# Patient Record
Sex: Male | Born: 1975 | Race: White | Hispanic: No | Smoking: Former smoker
Health system: Southern US, Community
[De-identification: ages and names within clinical notes are randomized; demographics above are authoritative.]

## PROBLEM LIST (undated history)

## (undated) DIAGNOSIS — E039 Hypothyroidism, unspecified: Secondary | ICD-10-CM

## (undated) DIAGNOSIS — Q6239 Other obstructive defects of renal pelvis and ureter: Secondary | ICD-10-CM

## (undated) DIAGNOSIS — K5720 Diverticulitis of large intestine with perforation and abscess without bleeding: Secondary | ICD-10-CM

## (undated) DIAGNOSIS — K219 Gastro-esophageal reflux disease without esophagitis: Secondary | ICD-10-CM

## (undated) DIAGNOSIS — K9041 Non-celiac gluten sensitivity: Secondary | ICD-10-CM

## (undated) DIAGNOSIS — Q6211 Congenital occlusion of ureteropelvic junction: Secondary | ICD-10-CM

## (undated) DIAGNOSIS — Z87442 Personal history of urinary calculi: Secondary | ICD-10-CM

## (undated) HISTORY — PX: CYSTOSCOPY WITH URETEROSCOPY, STONE BASKETRY AND STENT PLACEMENT: SHX6378

## (undated) HISTORY — PX: COSMETIC SURGERY: SHX468

## (undated) HISTORY — PX: LITHOTRIPSY: SUR834

## (undated) HISTORY — PX: FINGER AMPUTATION: SHX636

## (undated) HISTORY — PX: URETEROSCOPY: SHX842

## (undated) HISTORY — PX: ENDOPYELOTOMY: SHX1501

---

## 2013-07-19 ENCOUNTER — Emergency Department (HOSPITAL_BASED_OUTPATIENT_CLINIC_OR_DEPARTMENT_OTHER): Payer: 59

## 2013-07-19 ENCOUNTER — Encounter (HOSPITAL_BASED_OUTPATIENT_CLINIC_OR_DEPARTMENT_OTHER): Payer: Self-pay | Admitting: Emergency Medicine

## 2013-07-19 ENCOUNTER — Observation Stay (HOSPITAL_BASED_OUTPATIENT_CLINIC_OR_DEPARTMENT_OTHER)
Admission: EM | Admit: 2013-07-19 | Discharge: 2013-07-20 | Disposition: A | Discharge status: Home / Self Care | Payer: 59 | Attending: Urology | Admitting: Urology

## 2013-07-19 DIAGNOSIS — E039 Hypothyroidism, unspecified: Secondary | ICD-10-CM | POA: Insufficient documentation

## 2013-07-19 DIAGNOSIS — N132 Hydronephrosis with renal and ureteral calculous obstruction: Secondary | ICD-10-CM

## 2013-07-19 DIAGNOSIS — N1330 Unspecified hydronephrosis: Secondary | ICD-10-CM | POA: Insufficient documentation

## 2013-07-19 DIAGNOSIS — N200 Calculus of kidney: Principal | ICD-10-CM | POA: Insufficient documentation

## 2013-07-19 DIAGNOSIS — Q6239 Other obstructive defects of renal pelvis and ureter: Secondary | ICD-10-CM | POA: Insufficient documentation

## 2013-07-19 DIAGNOSIS — Z87891 Personal history of nicotine dependence: Secondary | ICD-10-CM | POA: Insufficient documentation

## 2013-07-19 DIAGNOSIS — Q6211 Congenital occlusion of ureteropelvic junction: Secondary | ICD-10-CM

## 2013-07-19 DIAGNOSIS — S68118A Complete traumatic metacarpophalangeal amputation of other finger, initial encounter: Secondary | ICD-10-CM | POA: Insufficient documentation

## 2013-07-19 HISTORY — DX: Congenital occlusion of ureteropelvic junction: Q62.11

## 2013-07-19 HISTORY — DX: Other obstructive defects of renal pelvis and ureter: Q62.39

## 2013-07-19 HISTORY — DX: Non-celiac gluten sensitivity: K90.41

## 2013-07-19 HISTORY — DX: Hypothyroidism, unspecified: E03.9

## 2013-07-19 MED ORDER — SODIUM CHLORIDE 0.9 % IV SOLN
INTRAVENOUS | Status: DC
  Administered 2013-07-19 – 2013-07-20 (×2): via INTRAVENOUS

## 2013-07-19 MED ORDER — HYDROMORPHONE HCL PF 1 MG/ML IJ SOLN
1.0000 mg | Freq: Once | INTRAMUSCULAR | Status: AC
  Administered 2013-07-19: 1 mg via INTRAVENOUS
  Filled 2013-07-19: qty 1

## 2013-07-19 MED ORDER — KETOROLAC TROMETHAMINE 30 MG/ML IJ SOLN
30.0000 mg | Freq: Once | INTRAMUSCULAR | Status: AC
  Administered 2013-07-19: 30 mg via INTRAVENOUS
  Filled 2013-07-19: qty 2

## 2013-07-19 MED ORDER — HYDROMORPHONE HCL PF 1 MG/ML IJ SOLN: 1.0000 mg | Freq: Once | INTRAMUSCULAR | Status: AC | PRN

## 2013-07-19 MED ORDER — ONDANSETRON HCL 4 MG/2ML IJ SOLN
4.0000 mg | Freq: Once | INTRAMUSCULAR | Status: AC
  Administered 2013-07-19: 4 mg via INTRAVENOUS
  Filled 2013-07-19: qty 2

## 2013-07-19 NOTE — ED Provider Notes (Signed)
CSN: [id number]     Arrival date & time 07/19/13  2257 History  This chart was scribed for Hanley Seamen, MD by Beverly Milch, ED Scribe. This patient was seen in room MH09/MH09 and the patient's care was started at 11:17 PM.    Chief Complaint  Patient presents with  . Flank Pain      The history is provided by the patient. No language interpreter was used.  HPI Comments: Shawn Gay is a 38 y.o. male with a h/o kidney stones who presents to the Emergency Department complaining of left sided flank pain onset "a couple hours ago." Pt reports pain began in LLQ and moved to his left flank. The pain is described as severe and has not worsened or improved with movement. Pt reports associated nausea, vomiting, and diaphoresis. Pt characterizes it as feeling like prior kidney stones.    Past Medical History  Diagnosis Date  . Kidney stone   . Hypothyroid     History reviewed. No pertinent past surgical history. No family history on file. History  Substance Use Topics  . Smoking status: Former Games developer  . Smokeless tobacco: Not on file  . Alcohol Use: No    Review of Systems A complete 10 system review of systems was obtained and all systems are negative except as noted in the HPI and PMH.     Allergies  Review of patient's allergies indicates no known allergies.  Home Medications   Current Outpatient Rx  Name  Route  Sig  Dispense  Refill  . levothyroxine (SYNTHROID, LEVOTHROID) 100 MCG tablet   Oral   Take 100 mcg by mouth daily before breakfast.          Triage Vitals: BP 155/107  Pulse 81  Temp(Src) 98.9 F (37.2 C) (Oral)  Resp 24  Ht 5\' 8"  (1.727 m)  Wt 240 lb (108.863 kg)  BMI 36.50 kg/m2  SpO2 97%  Physical Exam  Nursing note and vitals reviewed. General: Well-developed, well-nourished male in no acute distress; appearance consistent with age of record, appears uncomfortable HENT: normocephalic; atraumatic Eyes: pupils equal, round and reactive to  light; extraocular muscles intact Neck: supple Heart: regular rate and rhythm; no murmurs, rubs or gallops Lungs: clear to auscultation bilaterally Abdomen: soft; nondistended; mild LLQ tenderness; no masses or hepatosplenomegaly; bowel sounds present,  Extremities: No deformity; full range of motion; pulses normal Neurologic: Awake, alert and oriented; motor function intact in all extremities and symmetric; no facial droop Skin: Warm and diaphoretic Psychiatric: Anxious  ED Course  Procedures (including critical care time)  DIAGNOSTIC STUDIES: Oxygen Saturation is 97% on RA, adequate by my interpretation.    COORDINATION OF CARE: 11:27 PM- Pt advised of plan for treatment and pt agrees.     MDM   Final diagnoses:  Ureteral stone with hydronephrosis   Nursing notes and vitals signs, including pulse oximetry, reviewed.  Summary of this visit's results, reviewed by myself:  Labs:  Results for orders placed during the hospital encounter of 07/19/13 (from the past 24 hour(s))  CBC WITH DIFFERENTIAL     Status: None   Collection Time    07/19/13 11:10 PM      Result Value Ref Range   WBC 10.0  4.0 - 10.5 K/uL   RBC 5.37  4.22 - 5.81 MIL/uL   Hemoglobin 16.4  13.0 - 17.0 g/dL   HCT 40.9  81.1 - 91.4 %   MCV 84.9  78.0 - 100.0 fL  MCH 30.5  26.0 - 34.0 pg   MCHC 36.0  30.0 - 36.0 g/dL   RDW 16.1  09.6 - 04.5 %   Platelets 196  150 - 400 K/uL   Neutrophils Relative % 65  43 - 77 %   Neutro Abs 6.5  1.7 - 7.7 K/uL   Lymphocytes Relative 27  12 - 46 %   Lymphs Abs 2.7  0.7 - 4.0 K/uL   Monocytes Relative 7  3 - 12 %   Monocytes Absolute 0.7  0.1 - 1.0 K/uL   Eosinophils Relative 1  0 - 5 %   Eosinophils Absolute 0.1  0.0 - 0.7 K/uL   Basophils Relative 0  0 - 1 %   Basophils Absolute 0.0  0.0 - 0.1 K/uL  BASIC METABOLIC PANEL     Status: Abnormal   Collection Time    07/19/13 11:10 PM      Result Value Ref Range   Sodium 144  137 - 147 mEq/L   Potassium 3.8  3.7 -  5.3 mEq/L   Chloride 104  96 - 112 mEq/L   CO2 24  19 - 32 mEq/L   Glucose, Bld 114 (*) 70 - 99 mg/dL   BUN 10  6 - 23 mg/dL   Creatinine, Ser 4.09  0.50 - 1.35 mg/dL   Calcium 81.1  8.4 - 91.4 mg/dL   GFR calc non Af Amer >90  >90 mL/min   GFR calc Af Amer >90  >90 mL/min  URINALYSIS, ROUTINE W REFLEX MICROSCOPIC     Status: Abnormal   Collection Time    07/20/13  2:25 AM      Result Value Ref Range   Color, Urine YELLOW  YELLOW   APPearance TURBID (*) CLEAR   Specific Gravity, Urine 1.021  1.005 - 1.030   pH 8.0  5.0 - 8.0   Glucose, UA NEGATIVE  NEGATIVE mg/dL   Hgb urine dipstick LARGE (*) NEGATIVE   Bilirubin Urine NEGATIVE  NEGATIVE   Ketones, ur NEGATIVE  NEGATIVE mg/dL   Protein, ur 30 (*) NEGATIVE mg/dL   Urobilinogen, UA 0.2  0.0 - 1.0 mg/dL   Nitrite NEGATIVE  NEGATIVE   Leukocytes, UA SMALL (*) NEGATIVE  URINE MICROSCOPIC-ADD ON     Status: None   Collection Time    07/20/13  2:25 AM      Result Value Ref Range   Squamous Epithelial / LPF RARE  RARE   WBC, UA 7-10  <3 WBC/hpf   RBC / HPF 21-50  <3 RBC/hpf   Urine-Other AMORPHOUS URATES/PHOSPHATES      Imaging Studies: Ct Abdomen Pelvis Wo Contrast  07/20/2013   CLINICAL DATA:  Left flank pain and left lower quadrant pain with nausea and vomiting.  EXAM: CT ABDOMEN AND PELVIS WITHOUT CONTRAST  TECHNIQUE: Multidetector CT imaging of the abdomen and pelvis was performed following the standard protocol without intravenous contrast.  COMPARISON:  None.  FINDINGS: Lung bases are within normal.  Abdominal images demonstrate minimal fatty infiltration of the liver. The spleen, pancreas, gallbladder and adrenal glands are within normal. The appendix is normal.  Kidneys are normal in size. There is mild left-sided hydronephrosis with a 1.6 cm stone over the left UPJ. Remainder of the ureters are unremarkable. There are no right renal stones.  Pelvic images are unremarkable. Bones soft tissues are within normal.  IMPRESSION: 1.6  cm stone over the left UPJ causing low-grade obstruction.  Mild fatty infiltration of  the liver.   Electronically Signed   By: Elberta Fortisaniel  Boyle M.D.   On: 07/20/2013 00:01   1:37 AM Patient accepted for [facility] ED by Dr. Annabell HowellsWrenn. Pain significantly improved with IV medications. Patient still has not voided.   3:06 AM Urinalysis is suggestive of a concomitant urinary tract infection. Antibiotics were not started as this urine specimen was obtained as the patient was being transferred out of the department.   I personally performed the services described in this documentation, which was scribed in my presence. The recorded information has been reviewed and is accurate.    Hanley SeamenJohn L Ebbie Sorenson, MD 07/20/13 [phone]

## 2013-07-19 NOTE — ED Notes (Signed)
Onset of left sided flank pain several hours ago.  Patient is pale, profusely diaphoretic.  No difficulty with voiding.

## 2013-07-20 ENCOUNTER — Other Ambulatory Visit: Payer: Self-pay | Admitting: Urology

## 2013-07-20 ENCOUNTER — Other Ambulatory Visit: Payer: Self-pay | Admitting: Radiology

## 2013-07-20 ENCOUNTER — Encounter (HOSPITAL_COMMUNITY): Payer: Self-pay | Admitting: *Deleted

## 2013-07-20 DIAGNOSIS — Q6211 Congenital occlusion of ureteropelvic junction: Secondary | ICD-10-CM

## 2013-07-20 DIAGNOSIS — N200 Calculus of kidney: Secondary | ICD-10-CM

## 2013-07-20 DIAGNOSIS — Q6239 Other obstructive defects of renal pelvis and ureter: Secondary | ICD-10-CM

## 2013-07-20 LAB — URINALYSIS, ROUTINE W REFLEX MICROSCOPIC
BILIRUBIN URINE: NEGATIVE
Glucose, UA: NEGATIVE mg/dL
KETONES UR: NEGATIVE mg/dL
NITRITE: NEGATIVE
PROTEIN: 30 mg/dL — AB
Specific Gravity, Urine: 1.021 (ref 1.005–1.030)
UROBILINOGEN UA: 0.2 mg/dL (ref 0.0–1.0)
pH: 8 (ref 5.0–8.0)

## 2013-07-20 LAB — CBC WITH DIFFERENTIAL/PLATELET
BASOS PCT: 0 % (ref 0–1)
Basophils Absolute: 0 10*3/uL (ref 0.0–0.1)
EOS ABS: 0.1 10*3/uL (ref 0.0–0.7)
Eosinophils Relative: 1 % (ref 0–5)
HCT: 45.6 % (ref 39.0–52.0)
Hemoglobin: 16.4 g/dL (ref 13.0–17.0)
Lymphocytes Relative: 27 % (ref 12–46)
Lymphs Abs: 2.7 10*3/uL (ref 0.7–4.0)
MCH: 30.5 pg (ref 26.0–34.0)
MCHC: 36 g/dL (ref 30.0–36.0)
MCV: 84.9 fL (ref 78.0–100.0)
Monocytes Absolute: 0.7 10*3/uL (ref 0.1–1.0)
Monocytes Relative: 7 % (ref 3–12)
NEUTROS PCT: 65 % (ref 43–77)
Neutro Abs: 6.5 10*3/uL (ref 1.7–7.7)
PLATELETS: 196 10*3/uL (ref 150–400)
RBC: 5.37 MIL/uL (ref 4.22–5.81)
RDW: 12.8 % (ref 11.5–15.5)
WBC: 10 10*3/uL (ref 4.0–10.5)

## 2013-07-20 LAB — BASIC METABOLIC PANEL
BUN: 10 mg/dL (ref 6–23)
CALCIUM: 10.1 mg/dL (ref 8.4–10.5)
CO2: 24 mEq/L (ref 19–32)
Chloride: 104 mEq/L (ref 96–112)
Creatinine, Ser: 0.9 mg/dL (ref 0.50–1.35)
GFR calc Af Amer: >90 mL/min (ref >90)
Glucose, Bld: 114 mg/dL — ABNORMAL HIGH (ref 70–99)
Potassium: 3.8 mEq/L (ref 3.7–5.3)
SODIUM: 144 meq/L (ref 137–147)

## 2013-07-20 LAB — URINE MICROSCOPIC-ADD ON

## 2013-07-20 MED ORDER — HYDROMORPHONE HCL PF 1 MG/ML IJ SOLN: 1.0000 mg | INTRAMUSCULAR | Status: DC | PRN

## 2013-07-20 MED ORDER — ACETAMINOPHEN 325 MG PO TABS
650.0000 mg | ORAL_TABLET | Freq: Four times a day (QID) | ORAL | Status: DC | PRN
  Administered 2013-07-20: 650 mg via ORAL
  Filled 2013-07-20: qty 2

## 2013-07-20 MED ORDER — ONDANSETRON 4 MG PO TBDP: 4.0000 mg | ORAL_TABLET | Freq: Three times a day (TID) | ORAL | Status: DC | PRN

## 2013-07-20 MED ORDER — ONDANSETRON HCL 4 MG/2ML IJ SOLN: 4.0000 mg | Freq: Three times a day (TID) | INTRAMUSCULAR | Status: DC | PRN

## 2013-07-20 MED ORDER — OXYCODONE-ACETAMINOPHEN 5-325 MG PO TABS: 1.0000 | ORAL_TABLET | ORAL | Status: DC | PRN

## 2013-07-20 NOTE — ED Notes (Signed)
Attempted to call report and received no answer. Will try again.

## 2013-07-20 NOTE — ED Notes (Signed)
Bed: WA06 Expected date:  Expected time:  Means of arrival:  Comments: Transfer from [facility], 15mm kidney stone

## 2013-07-20 NOTE — Discharge Instructions (Signed)
Percutaneous Nephrolithotomy °Kidney stones can cause a great deal of pain. They can block urine from leaving the body. And they can lead to infection. Kidney stones might pass on their own, or they can be broken up by shock waves from a special machine. But sometimes surgery is needed to get rid of kidney stones. One type of surgery is called percutaneous nephrolithotomy. "Nephro" means kidney, "litho" means stone and "tomy" means removal by surgery. "Percutaneous" means through the skin. This type of surgery needs only a small cut (incision) in the skin. °This surgery may be suggested for various reasons. They include: °· The kidney stones are 2 centimeters wide (about 3/4 inch) or bigger. They also might be oddly shaped. °· Other treatments were tried, but all or some of the kidney stones remain. °· Infection has developed. °· No other treatment can be used. °LET YOUR CAREGIVER KNOW ABOUT:  °· Any allergies. °· All medications you are taking, including: °· Herbs, eyedrops, over-the-counter medications and creams. °· Blood thinners (anticoagulants), aspirin or other drugs that could affect blood clotting. °· Use of steroids (by mouth or as creams). °· Previous problems with anesthetics, including local anesthetics. °· Possibility of pregnancy, if this applies. °· Any history of blood clots. °· Any history of bleeding or other blood problems. °· Previous surgery. °· Smoking history. °· Other health problems. °RISKS AND COMPLICATIONS  °· During surgery: °· Sometimes all the kidney stones cannot be removed through the tube. Then, the percutaneous nephrolithotomy procedure would be stopped. Open surgery would be used to remove the remaining stones. °· Short-term risks from the surgery could include: °· Excessive bleeding. °· Blood in your urine. °· Holes in the kidney. These usually heal on their own. °· Pain. °· Redness or tenderness at the incision site. °· Numbness (loss of feeling) in the area treated. Tingling  also is possible. °· A pooling of blood in the wound (hematoma). °· Infection. °· Slow healing. °· Longer-term possibilities include: °· Kidney damage. °· Damage to organs near the kidney. °· Need for a repeat surgery. °BEFORE THE PROCEDURE °· You may need to take some tests before your surgery. These might include: °· Blood tests. °· Urine tests. °· Tests to make sure your heart is working properly. °· Let your healthcare provider know if you think you might have a urinary tract infection. You will probably need to take an antibiotic to treat this before the surgery. °· Two weeks before your surgery, stop using aspirin and non-steroidal anti-inflammatory drugs (NSAIDs) for pain relief. This includes prescription drugs and over-the-counter drugs such as ibuprofen and naproxen. Also stop taking vitamin E. °· If you take blood-thinners, ask your healthcare provider when you should stop taking them. °· Do not eat or drink for about 8 hours before your surgery. °· You might be asked to shower or wash with a special antibacterial soap before the procedure. °· Arrive at least an hour before the surgery, or whenever your surgeon recommends. This will give you time to check in and fill out any needed paperwork. °PROCEDURE °· The preparation: °· You will change into a hospital gown. °· You will be given an IV. A needle will be inserted in your arm. Medication will be able to flow directly into your body through this needle. °· You might be given a sedative. This medication will help you relax. °· You may be given a general anesthetic (a drug that will put you to sleep during the surgery). Or, you may   get a local anesthetic (part of your body will be numb, but you will remain awake). °· A catheter (tube) will be put in your bladder to drain urine during and after surgery. °· The procedure: °· The surgeon will make a small incision in your lower back. °· A tube will be inserted through the incision into your kidney. °· Each  kidney stone is removed through this tube. Larger stones may first be broken up with a laser (a high-intensity light beam) or other tools. °· If a kidney stone has already left the kidney, the surgeon would use a special tool to bring it back in. Then it would be removed through the tube. °· After all the stones are taken out, a catheter will be put in. Fluid can build up around the kidney as it heals. The catheter lets this fluid drain out of the body. °· A dressing (medicine and bandage) will be put on the incision area. °· The surgery usually takes three to four hours. °AFTER THE PROCEDURE °· You will stay in a recovery area until the anesthesia has worn off. Your blood pressure and pulse will be checked often. You might be given more pain medication. °· You will be moved to a hospital room for the rest of your stay. °· The catheter that is taking urine out of your body will be taken out within 24 hours. °· The day after your surgery, you should be able to walk around. Walking helps prevent blood clots (thick clumps that can block the flow of blood). °· You may be asked to do some breathing exercises. °· You will be able to have only liquids for a day or two. °· Before you go home: °· The catheter that is draining fluid from the kidney area is usually taken out. °· You will be taught how to care for the incision. Be sure to ask how often the dressing should be changed and when it can get wet. °· You also will be told what you should and should not do while your kidney and incisions heal. For instance, you may be urged to walk to prevent blood clots. °Document Released: 01/28/2009 Document Revised: 06/25/2011 Document Reviewed: 01/28/2009 °Exit[employer]® Patient Information ©2014 [employer]. ° °

## 2013-07-20 NOTE — ED Notes (Signed)
Bed: WA06 Expected date:  Expected time:  Means of arrival:  Comments: PT from [facility]

## 2013-07-20 NOTE — ED Notes (Signed)
Pt unable to void - EDP aware.

## 2013-07-20 NOTE — Care Management Note (Signed)
    Page 1 of 1   07/20/2013     11:26:43 AM   CARE MANAGEMENT NOTE 07/20/2013  Patient:  Shawn Gay,Shawn Gay   Account Number:  [id number]  Date Initiated:  07/20/2013  Documentation initiated by:  Lanier ClamMAHABIR,Tomesha Sargent  Subjective/Objective Assessment:   38 Y/O Gay ADMITTED W/L NEPHROLITHIASIS.     Action/Plan:   FROM HOME.   Anticipated DC Date:  07/20/2013   Anticipated DC Plan:  HOME/SELF CARE      DC Planning Services  CM consult      Choice offered to / List presented to:             Status of service:  Completed, signed off Medicare Important Message given?   (If response is "NO", the following Medicare IM given date fields will be blank) Date Medicare IM given:   Date Additional Medicare IM given:    Discharge Disposition:  HOME/SELF CARE  Per UR Regulation:  Reviewed for med. necessity/level of care/duration of stay  If discussed at Long Length of Stay Meetings, dates discussed:    Comments:  07/20/13 Shamone Winzer RN,BSN NCM [phone] D/C HOME NO NEEDS OR ORDERS.

## 2013-07-20 NOTE — ED Provider Notes (Signed)
38 yo male with hx of kidney stones in past presents to ED today after [facility] and dx of 1.6 cm stone over the left UPJ causing low grade obstruction. Urology consulted and plans to see patient today in ED. Patient currently comrfortable in bed. Pain under control currently. Normal HR with regular rhythm. Lungs CTA bilaterally. Abdomen soft and nontender on my exam.       Rudene AndaJacob Gray Ason Heslin, PA-C 07/22/13 [phone]

## 2013-07-20 NOTE — Discharge Summary (Signed)
Physician Discharge Summary  Patient ID: Shawn Gay MRN: [id number] DOB/AGE: 36/05/1975 36 y.o.  Admit date: 07/19/2013 Discharge date: 07/20/2013  Admission Diagnoses:  Left nephrolithiasis  Discharge Diagnoses:  Principal Problem:   Left nephrolithiasis Active Problems:   UPJ obstruction, congenital   Past Medical History  Diagnosis Date  . Kidney stone   . Hypothyroid   . UPJ obstruction, congenital   . Gluten intolerance     Surgeries:  on * No surgery found *   Consultants (if any):  none  Discharged Condition: Improved  Hospital Course: Shawn Gay is an 38 y.o. male who was admitted 07/19/2013 with a diagnosis of Left nephrolithiasis and a left UPJ obstruction.   He became pain free with medication and was discharged home with plans to return for a left percutaneous nephrolithotomy.    He was given perioperative antibiotics:  Anti-infectives   None    .  He was given early ambulation for DVT prophylaxis.  He benefited maximally from the hospital stay and there were no complications.    Recent vital signs:  Filed Vitals:   07/20/13 0507  BP: 121/73  Pulse: 77  Temp: 98.2 F (36.8 C)  Resp: 20    Recent laboratory studies:  Lab Results  Component Value Date   HGB 16.4 07/19/2013   Lab Results  Component Value Date   WBC 10.0 07/19/2013   PLT 196 07/19/2013   No results found for this basename: INR   Lab Results  Component Value Date   NA 144 07/19/2013   K 3.8 07/19/2013   CL 104 07/19/2013   CO2 24 07/19/2013   BUN 10 07/19/2013   CREATININE 0.90 07/19/2013   GLUCOSE 114* 07/19/2013    Discharge Medications:     Medication List         DIGESTIVE ENZYME PO  Take 1 tablet by mouth daily.     levothyroxine 100 MCG tablet  Commonly known as:  SYNTHROID, LEVOTHROID  Take 100 mcg by mouth daily before breakfast.     ondansetron 4 MG disintegrating tablet  Commonly known as:  ZOFRAN ODT  Take 1 tablet (4 mg total) by mouth every 8 (eight) hours as  needed for nausea or vomiting.     oxyCODONE-acetaminophen 5-325 MG per tablet  Commonly known as:  ROXICET  Take 1 tablet by mouth every 4 (four) hours as needed for severe pain.     PROBIOTIC PO  Take 1 tablet by mouth daily.        Diagnostic Studies: Ct Abdomen Pelvis Wo Contrast  07/20/2013   CLINICAL DATA:  Left flank pain and left lower quadrant pain with nausea and vomiting.  EXAM: CT ABDOMEN AND PELVIS WITHOUT CONTRAST  TECHNIQUE: Multidetector CT imaging of the abdomen and pelvis was performed following the standard protocol without intravenous contrast.  COMPARISON:  None.  FINDINGS: Lung bases are within normal.  Abdominal images demonstrate minimal fatty infiltration of the liver. The spleen, pancreas, gallbladder and adrenal glands are within normal. The appendix is normal.  Kidneys are normal in size. There is mild left-sided hydronephrosis with a 1.6 cm stone over the left UPJ. Remainder of the ureters are unremarkable. There are no right renal stones.  Pelvic images are unremarkable. Bones soft tissues are within normal.  IMPRESSION: 1.6 cm stone over the left UPJ causing low-grade obstruction.  Mild fatty infiltration of the liver.   Electronically Signed   By: Elberta Fortisaniel  Boyle M.D.   On: 07/20/2013 00:01  Disposition: Home      Discharge Orders   Future Orders Complete By Expires   Discontinue IV  As directed       Follow-up Information   Follow up with Anner Crete, MD. (My office will call to schedule surgery)    Specialty:  Urology   Contact information:   [address] [place] [facility]  [place] [address] [phone]        Signed: Anner Crete 07/20/2013, 7:42 AM

## 2013-07-20 NOTE — H&P (Signed)
Subjective: Shawn Gay is a 38yo WM who was seen at [facility] over night for left flank pain.  The pain began yesterday and was severe.  The pain was associated with nausea and vomiting.  He was treated with toradol and hydromorphone and a CT was obtained that demonstrated a 1.6cm stone at the left UPJ with obstruction.  Shawn Gay had a similar but less severe episode of pain in December and reports that this stone has been present for some time.   He has a  History of stones and a left UPJ obstruction and has had prior lithotripsy, ureteroscopy, stents and an endopyelotomy.  He has no voiding complaints or hematuria.  He is currently pain free.  ROS:  Review of Systems  Constitutional: Positive for diaphoresis. Negative for fever and chills.  Respiratory: Negative for shortness of breath.   Cardiovascular: Negative for chest pain and leg swelling.  Gastrointestinal: Positive for nausea, vomiting and abdominal pain. Negative for diarrhea and constipation.  Genitourinary: Positive for flank pain. Negative for dysuria, urgency and hematuria.  All other systems reviewed and are negative.    Anti-infectives: Anti-infectives   None      Current Facility-Administered Medications  Medication Dose Route Frequency Provider Last Rate Last Dose  . 0.9 %  sodium chloride infusion   Intravenous Continuous Bjorn Pippin, MD 75 mL/hr at 07/20/13 0630    . HYDROmorphone (DILAUDID) injection 1 mg  1 mg Intravenous Q4H PRN Ankit Nanavati, MD      . ondansetron (ZOFRAN) injection 4 mg  4 mg Intravenous Q8H PRN Derwood Kaplan, MD       Home Meds: Synthroid.  Allergies  Allergen Reactions  . Gluten Meal Other (See Comments)    Gas [place] Stain    Past Medical History  Diagnosis Date  . Kidney stone   . Hypothyroid   . UPJ obstruction, congenital   . Gluten intolerance     Past Surgical History  Procedure Laterality Date  . Lithotripsy    . Ureteroscopy    . Endopyelotomy    . Finger  amputation      History   Social History  . Marital Status: Single    Spouse Name: N/A    Number of Children: N/A  . Years of Education: N/A   Occupational History  . student    Social History Main Topics  . Smoking status: Former Games developer  . Smokeless tobacco: Not on file  . Alcohol Use: No  . Drug Use: No  . Sexual Activity: Not on file   Other Topics Concern  . Not on file   Social History Narrative  . No narrative on file    Family History  Problem Relation Age of Onset  . Diabetes Paternal Grandfather   . Cancer Paternal Grandfather      Objective: Vital signs in last 24 hours: Temp:  [98.2 F (36.8 C)-98.9 F (37.2 C)] 98.2 F (36.8 C) (04/06 0507) Pulse Rate:  [59-98] 77 (04/06 0507) Resp:  [20-24] 20 (04/06 0507) BP: (121-155)/(73-107) 121/73 mmHg (04/06 0507) SpO2:  [96 %-100 %] 96 % (04/06 0507) Weight:  [108.863 kg (240 lb)-109.5 kg (241 lb 6.5 oz)] 109.5 kg (241 lb 6.5 oz) (04/06 0500)  Intake/Output from previous day: 04/05 0701 - 04/06 0700 In: 870.8 [I.V.:870.8] Out: 125 [Urine:125] Intake/Output this shift:     Physical Exam  Constitutional: He is oriented to person, place, and time and well-developed, well-nourished, and in no distress. No  distress.  HENT:  Head: Normocephalic and atraumatic.  Neck: Normal range of motion. Neck supple. No thyromegaly present.  Cardiovascular: Normal rate and regular rhythm.   No murmur heard. Pulmonary/Chest: Effort normal and breath sounds normal. No respiratory distress.  Abdominal: Soft. Bowel sounds are normal. He exhibits no distension and no mass. There is no tenderness. There is no rebound.  Musculoskeletal: Normal range of motion. He exhibits no edema and no tenderness.  Neurological: He is alert and oriented to person, place, and time.  No focal deficits  Skin: Skin is warm and dry.  Psychiatric: Mood and affect normal.    Lab Results:   Recent Labs  07/19/13 2310  WBC 10.0  HGB 16.4   HCT 45.6  PLT 196   BMET  Recent Labs  07/19/13 2310  NA 144  K 3.8  CL 104  CO2 24  GLUCOSE 114*  BUN 10  CREATININE 0.90  CALCIUM 10.1   PT/INR No results found for this basename: LABPROT, INR,  in the last 72 hours ABG No results found for this basename: PHART, PCO2, PO2, HCO3,  in the last 72 hours  Studies/Results: Ct Abdomen Pelvis Wo Contrast  07/20/2013   CLINICAL DATA:  Left flank pain and left lower quadrant pain with nausea and vomiting.  EXAM: CT ABDOMEN AND PELVIS WITHOUT CONTRAST  TECHNIQUE: Multidetector CT imaging of the abdomen and pelvis was performed following the standard protocol without intravenous contrast.  COMPARISON:  None.  FINDINGS: Lung bases are within normal.  Abdominal images demonstrate minimal fatty infiltration of the liver. The spleen, pancreas, gallbladder and adrenal glands are within normal. The appendix is normal.  Kidneys are normal in size. There is mild left-sided hydronephrosis with a 1.6 cm stone over the left UPJ. Remainder of the ureters are unremarkable. There are no right renal stones.  Pelvic images are unremarkable. Bones soft tissues are within normal.  IMPRESSION: 1.6 cm stone over the left UPJ causing low-grade obstruction.  Mild fatty infiltration of the liver.   Electronically Signed   By: Elberta Fortisaniel  Boyle M.D.   On: 07/20/2013 00:01   I have discussed his case with Dr. Read DriversMolpus and reviewed his labs and CT films and report.   His UA had 7-10 WBC and 21-50 RBC but there were no bacteria or nit.    Assessment: He has a 1.6cm LUPJ stone with some hydronephrosis but a history of a UPJ obstruction which can make the obstruction appear worse than it is.  He is currently pain free.    This episode may have been precipitated by [place] [facility] [place] and [place] in [place].    Plan: His situation is complicated by the fact that he is in town for 3 months for school and he has had prior issues with stones  and a UPJ obstruction.    I explained the options for treatment including ESWL, ureteroscopy and a percutaneous nephrolithotomy.   I explained that based on his prior surgical history and desire to avoid a stent if possible that a percutaneous nephrolithotomy would be the best option to render him stone free and stent free.   I reviewed the risks of that procedure including bleeding, infection, injury to the kidney and adjacent organs, AV fistula formation, need for secondary procedures, urine leaks, thrombotic events and anesthetic complications.     He had toradol so we can't to a perc acutely or ESWL for that matter  and he is currently pain free.    I will send him home with pain and nausea med and after he discussed the situation with his sister, he wants to go ahead with the PCNL.   CC: Dr. Brent General,  [facility], [place].     LOS: 1 day    Kileen Lange J 07/20/2013

## 2013-07-20 NOTE — Progress Notes (Signed)
Notified Dr Annabell HowellsWrenn of pt's admission to Room#[id number] at 0625am via telephone call, Decreased the IV rate to 75cc/hr as per telephone orders. Pt wants to keep his clothes on at this time & not wear pt gown. Gown available in his room when ready to change over to it.

## 2013-07-21 ENCOUNTER — Other Ambulatory Visit: Payer: Self-pay | Admitting: Urology

## 2013-07-21 ENCOUNTER — Encounter (HOSPITAL_COMMUNITY): Payer: Self-pay | Admitting: Pharmacy Technician

## 2013-07-21 LAB — URINE CULTURE
CULTURE: NO GROWTH
Colony Count: NO GROWTH

## 2013-07-21 NOTE — Patient Instructions (Addendum)
20 Shawn Gay  07/21/2013   Your procedure is scheduled on: Thursday April 9th, 2015  Report to [facility] at 900 AM.  Call this number if you have problems the morning of surgery [phone]   Remember:  Do not eat food or drink liquids :After Midnight.     Take these medicines the morning of surgery with A SIP OF WATER: levothyroxine                               You may not have any metal on your body including hair pins and piercings  Do not wear jewelry, make-up, lotions, powders, or deodorant.   Men may shave face and neck.  Do not bring valuables to the hospital. [facility] IS NOT RESPONSIBLE FOR VALUABLES.  Contacts, dentures or bridgework may not be worn into surgery.  Leave suitcase in the car. After surgery it may be brought to your room.  For patients admitted to the hospital, checkout time is 11:00 AM the day of discharge.   [facility] - Preparing for Surgery  Before surgery, you can play an important role.  Because skin is not sterile, your skin needs to be as free of germs as possible.  You can reduce the number of germs on your skin by washing with CHG (chlorahexidine gluconate) soap before surgery.  CHG is an antiseptic cleaner which kills germs and bonds with the skin to continue killing germs even after washing. Please DO NOT use if you have an allergy to CHG or antibacterial soaps.  If your skin becomes reddened/irritated stop using the CHG and inform your nurse when you arrive at Short Stay. Do not shave (including legs and underarms) for at least 48 hours prior to the first CHG shower.  You may shave your face. Please follow these instructions carefully:  1.  Shower with CHG Soap the night before surgery and the  morning of Surgery.  2.  If you choose to wash your hair, wash your hair first as usual with your  normal  shampoo.  3.  After you shampoo, rinse your hair and body thoroughly to remove the  shampoo.                           4.  Use CHG as  you would any other liquid soap.  You can apply chg directly  to the skin and wash                       Gently with a scrungie or clean washcloth.  5.  Apply the CHG Soap to your body ONLY FROM THE NECK DOWN.   Do not use on open                           Wound or open sores. Avoid contact with eyes, ears mouth and genitals (private parts).                        Genitals (private parts) with your normal soap.             6.  Wash thoroughly, paying special attention to the area where your surgery  will be performed.  7.  Thoroughly rinse your body with warm water from the neck down.  8.  DO  NOT shower/wash with your normal soap after using and rinsing off  the CHG Soap.                9.  Pat yourself dry with a clean towel.            10.  Wear clean pajamas.            11.  Place clean sheets on your bed the night of your first shower and do not  sleep with pets. Day of Surgery : Do not apply any lotions/deodorants the morning of surgery.  Please wear clean clothes to the hospital/surgery center.  FAILURE TO FOLLOW THESE INSTRUCTIONS MAY RESULT IN THE CANCELLATION OF YOUR SURGERY PATIENT SIGNATURE_________________________________  NURSE SIGNATURE__________________________________  WHAT IS A BLOOD TRANSFUSION? Blood Transfusion Information  A transfusion is the replacement of blood or some of its parts. Blood is made up of multiple cells which provide different functions.  Red blood cells carry oxygen and are used for blood loss replacement.  White blood cells fight against infection.  Platelets control bleeding.  Plasma helps clot blood.  Other blood products are available for specialized needs, such as hemophilia or other clotting disorders. BEFORE THE TRANSFUSION  Who gives blood for transfusions?   Healthy volunteers who are fully evaluated to make sure their blood is safe. This is blood bank blood. Transfusion therapy is the safest it has ever been in the practice of  medicine. Before blood is taken from a donor, a complete history is taken to make sure that person has no history of diseases nor engages in risky social behavior (examples are intravenous drug use or sexual activity with multiple partners). The donor's travel history is screened to minimize risk of transmitting infections, such as malaria. The donated blood is tested for signs of infectious diseases, such as HIV and hepatitis. The blood is then tested to be sure it is compatible with you in order to minimize the chance of a transfusion reaction. If you or a relative donates blood, this is often done in anticipation of surgery and is not appropriate for emergency situations. It takes many days to process the donated blood. RISKS AND COMPLICATIONS Although transfusion therapy is very safe and saves many lives, the main dangers of transfusion include:   Getting an infectious disease.  Developing a transfusion reaction. This is an allergic reaction to something in the blood you were given. Every precaution is taken to prevent this. The decision to have a blood transfusion has been considered carefully by your caregiver before blood is given. Blood is not given unless the benefits outweigh the risks. AFTER THE TRANSFUSION  Right after receiving a blood transfusion, you will usually feel much better and more energetic. This is especially true if your red blood cells have gotten low (anemic). The transfusion raises the level of the red blood cells which carry oxygen, and this usually causes an energy increase.  The nurse administering the transfusion will monitor you carefully for complications. HOME CARE INSTRUCTIONS  No special instructions are needed after a transfusion. You may find your energy is better. Speak with your caregiver about any limitations on activity for underlying diseases you may have. SEEK MEDICAL CARE IF:   Your condition is not improving after your transfusion.  You develop  redness or irritation at the intravenous (IV) site. SEEK IMMEDIATE MEDICAL CARE IF:  Any of the following symptoms occur over the next 12 hours:  Shaking chills.  You have a temperature  by mouth above 102 F (38.9 C), not controlled by medicine.  Chest, back, or muscle pain.  People around you feel you are not acting correctly or are confused.  Shortness of breath or difficulty breathing.  Dizziness and fainting.  You get a rash or develop hives.  You have a decrease in urine output.  Your urine turns a dark color or changes to pink, red, or brown. Any of the following symptoms occur over the next 10 days:  You have a temperature by mouth above 102 F (38.9 C), not controlled by medicine.  Shortness of breath.  Weakness after normal activity.  The white part of the eye turns yellow (jaundice).  You have a decrease in the amount of urine or are urinating less often.  Your urine turns a dark color or changes to pink, red, or brown. Document Released: 03/30/2000 Document Revised: 06/25/2011 Document Reviewed: 11/17/2007 [facility] Patient Information 2014 [place], [place].

## 2013-07-22 ENCOUNTER — Encounter (HOSPITAL_COMMUNITY): Payer: Self-pay

## 2013-07-22 ENCOUNTER — Encounter (HOSPITAL_COMMUNITY)
Admission: RE | Admit: 2013-07-22 | Discharge: 2013-07-22 | Disposition: A | Discharge status: Home / Self Care | Payer: 59 | Source: Ambulatory Visit | Attending: Urology | Admitting: Urology

## 2013-07-22 LAB — ABO/RH: ABO/RH(D): O POS

## 2013-07-22 LAB — PROTIME-INR
INR: 1.03 (ref 0.00–1.49)
Prothrombin Time: 13.3 seconds (ref 11.6–15.2)

## 2013-07-22 LAB — APTT: aPTT: 30 seconds (ref 24–37)

## 2013-07-22 NOTE — ED Provider Notes (Signed)
Agree with the assessment as above. Pt is comfortable. Awaiting admission bed.  Shawn KaplanAnkit Cindie Rajagopalan, MD 07/22/13 [phone]

## 2013-07-22 NOTE — Progress Notes (Signed)
07/22/13 1326  OBSTRUCTIVE SLEEP APNEA  Have you ever been diagnosed with sleep apnea through a sleep study? No  Do you snore loudly (loud enough to be heard through closed doors)?  1  Do you often feel tired, fatigued, or sleepy during the daytime? 1  Has anyone observed you stop breathing during your sleep? 0  Do you have, or are you being treated for high blood pressure? 0  BMI more than 35 kg/m2? 1  Age over 36 years old? 0  Neck circumference greater than 40 cm/18 inches? 0  Gender: 1  Obstructive Sleep Apnea Score 4  Score 4 or greater  Results sent to PCP

## 2013-07-22 NOTE — Progress Notes (Signed)
Cbc with dri and bmet 07-19-13 epic

## 2013-07-23 ENCOUNTER — Ambulatory Visit (HOSPITAL_COMMUNITY): Payer: 59

## 2013-07-23 ENCOUNTER — Encounter (HOSPITAL_COMMUNITY): Payer: Self-pay

## 2013-07-23 ENCOUNTER — Observation Stay (HOSPITAL_COMMUNITY)
Admission: RE | Admit: 2013-07-23 | Discharge: 2013-07-24 | Disposition: A | Discharge status: Home / Self Care | Payer: 59 | Source: Ambulatory Visit | Attending: Urology | Admitting: Urology

## 2013-07-23 ENCOUNTER — Ambulatory Visit (HOSPITAL_COMMUNITY): Payer: 59 | Admitting: Anesthesiology

## 2013-07-23 ENCOUNTER — Encounter (HOSPITAL_COMMUNITY): Payer: 59 | Admitting: Anesthesiology

## 2013-07-23 ENCOUNTER — Ambulatory Visit (HOSPITAL_COMMUNITY)
Admission: RE | Admit: 2013-07-23 | Discharge: 2013-07-23 | Disposition: A | Discharge status: Home / Self Care | Payer: 59 | Source: Ambulatory Visit | Attending: Urology | Admitting: Urology

## 2013-07-23 ENCOUNTER — Encounter (HOSPITAL_COMMUNITY): Payer: Self-pay | Admitting: *Deleted

## 2013-07-23 ENCOUNTER — Encounter (HOSPITAL_COMMUNITY)
Admission: RE | Disposition: A | Discharge status: Home / Self Care | Payer: Self-pay | Source: Ambulatory Visit | Attending: Urology

## 2013-07-23 VITALS — BP 124/81 | HR 73 | Temp 98.6°F | Resp 10

## 2013-07-23 DIAGNOSIS — K900 Celiac disease: Secondary | ICD-10-CM | POA: Insufficient documentation

## 2013-07-23 DIAGNOSIS — S68118A Complete traumatic metacarpophalangeal amputation of other finger, initial encounter: Secondary | ICD-10-CM | POA: Insufficient documentation

## 2013-07-23 DIAGNOSIS — N1330 Unspecified hydronephrosis: Secondary | ICD-10-CM | POA: Insufficient documentation

## 2013-07-23 DIAGNOSIS — E039 Hypothyroidism, unspecified: Secondary | ICD-10-CM | POA: Insufficient documentation

## 2013-07-23 DIAGNOSIS — N200 Calculus of kidney: Secondary | ICD-10-CM

## 2013-07-23 DIAGNOSIS — Z87891 Personal history of nicotine dependence: Secondary | ICD-10-CM | POA: Insufficient documentation

## 2013-07-23 HISTORY — PX: NEPHROLITHOTOMY: SHX5134

## 2013-07-23 LAB — TYPE AND SCREEN
ABO/RH(D): O POS
Antibody Screen: NEGATIVE

## 2013-07-23 SURGERY — NEPHROLITHOTOMY PERCUTANEOUS
Anesthesia: General | Laterality: Left

## 2013-07-23 MED ORDER — IOHEXOL 300 MG/ML  SOLN
INTRAMUSCULAR | Status: DC | PRN
  Administered 2013-07-23: 20 mL

## 2013-07-23 MED ORDER — MEPERIDINE HCL 50 MG/ML IJ SOLN
INTRAMUSCULAR | Status: AC
Start: 2013-07-23 — End: 2013-07-24
  Filled 2013-07-23: qty 1

## 2013-07-23 MED ORDER — MIDAZOLAM HCL 5 MG/5ML IJ SOLN
INTRAMUSCULAR | Status: DC | PRN
  Administered 2013-07-23: 2 mg via INTRAVENOUS

## 2013-07-23 MED ORDER — FENTANYL CITRATE 0.05 MG/ML IJ SOLN
INTRAMUSCULAR | Status: DC | PRN
  Administered 2013-07-23: 50 ug via INTRAVENOUS

## 2013-07-23 MED ORDER — DOCUSATE SODIUM 100 MG PO CAPS
100.0000 mg | ORAL_CAPSULE | Freq: Two times a day (BID) | ORAL | Status: DC
  Administered 2013-07-23 – 2013-07-24 (×2): 100 mg via ORAL
  Filled 2013-07-23 (×3): qty 1

## 2013-07-23 MED ORDER — OXYCODONE HCL 5 MG/5ML PO SOLN: 5.0000 mg | Freq: Once | ORAL | Status: DC | PRN

## 2013-07-23 MED ORDER — HYDROMORPHONE HCL PF 1 MG/ML IJ SOLN
INTRAMUSCULAR | Status: AC
  Filled 2013-07-23: qty 1

## 2013-07-23 MED ORDER — CIPROFLOXACIN HCL 500 MG PO TABS
500.0000 mg | ORAL_TABLET | Freq: Two times a day (BID) | ORAL | Status: DC
  Administered 2013-07-23 – 2013-07-24 (×2): 500 mg via ORAL
  Filled 2013-07-23 (×4): qty 1

## 2013-07-23 MED ORDER — FENTANYL CITRATE 0.05 MG/ML IJ SOLN
INTRAMUSCULAR | Status: AC
  Filled 2013-07-23: qty 5

## 2013-07-23 MED ORDER — DEXAMETHASONE SODIUM PHOSPHATE 10 MG/ML IJ SOLN
INTRAMUSCULAR | Status: DC | PRN
  Administered 2013-07-23: 10 mg via INTRAVENOUS

## 2013-07-23 MED ORDER — ACETAMINOPHEN 10 MG/ML IV SOLN
1000.0000 mg | INTRAVENOUS | Status: AC
  Administered 2013-07-23: 1000 mg via INTRAVENOUS
  Filled 2013-07-23 (×2): qty 100

## 2013-07-23 MED ORDER — SUCCINYLCHOLINE CHLORIDE 20 MG/ML IJ SOLN
INTRAMUSCULAR | Status: DC | PRN
  Administered 2013-07-23: 100 mg via INTRAVENOUS

## 2013-07-23 MED ORDER — MEPERIDINE HCL 50 MG/ML IJ SOLN
6.2500 mg | INTRAMUSCULAR | Status: DC | PRN
  Administered 2013-07-23: 12.5 mg via INTRAVENOUS

## 2013-07-23 MED ORDER — HYDROMORPHONE HCL PF 1 MG/ML IJ SOLN
0.5000 mg | INTRAMUSCULAR | Status: DC | PRN
  Administered 2013-07-23 (×2): 1 mg via INTRAVENOUS
  Filled 2013-07-23 (×2): qty 1

## 2013-07-23 MED ORDER — BISACODYL 10 MG RE SUPP: 10.0000 mg | Freq: Every day | RECTAL | Status: DC | PRN

## 2013-07-23 MED ORDER — PROPOFOL 10 MG/ML IV BOLUS
INTRAVENOUS | Status: AC
  Filled 2013-07-23: qty 20

## 2013-07-23 MED ORDER — SODIUM CHLORIDE 0.9 % IR SOLN
Status: DC | PRN
Start: 2013-07-23 — End: 2013-07-23
  Administered 2013-07-23: 4000 mL

## 2013-07-23 MED ORDER — FENTANYL CITRATE 0.05 MG/ML IJ SOLN
INTRAMUSCULAR | Status: AC | PRN
  Administered 2013-07-23: 100 ug via INTRAVENOUS

## 2013-07-23 MED ORDER — ONDANSETRON HCL 4 MG/2ML IJ SOLN
4.0000 mg | INTRAMUSCULAR | Status: DC | PRN
  Administered 2013-07-24: 4 mg via INTRAVENOUS
  Filled 2013-07-23: qty 2

## 2013-07-23 MED ORDER — LEVOTHYROXINE SODIUM 100 MCG PO TABS
100.0000 ug | ORAL_TABLET | Freq: Every day | ORAL | Status: DC
  Administered 2013-07-24: 100 ug via ORAL
  Filled 2013-07-23 (×2): qty 1

## 2013-07-23 MED ORDER — DEXAMETHASONE SODIUM PHOSPHATE 10 MG/ML IJ SOLN
INTRAMUSCULAR | Status: AC
  Filled 2013-07-23: qty 1

## 2013-07-23 MED ORDER — PROPOFOL 10 MG/ML IV BOLUS
INTRAVENOUS | Status: DC | PRN
  Administered 2013-07-23: 160 mg via INTRAVENOUS

## 2013-07-23 MED ORDER — LACTATED RINGERS IV SOLN
INTRAVENOUS | Status: DC
  Administered 2013-07-23: 1000 mL via INTRAVENOUS
  Administered 2013-07-23: 14:00:00 via INTRAVENOUS

## 2013-07-23 MED ORDER — SODIUM CHLORIDE 0.9 % IV SOLN
INTRAVENOUS | Status: DC
  Administered 2013-07-23: 10 mL/h via INTRAVENOUS

## 2013-07-23 MED ORDER — MIDAZOLAM HCL 2 MG/2ML IJ SOLN
INTRAMUSCULAR | Status: AC
  Filled 2013-07-23: qty 4

## 2013-07-23 MED ORDER — LIDOCAINE HCL (CARDIAC) 20 MG/ML IV SOLN
INTRAVENOUS | Status: AC
  Filled 2013-07-23: qty 5

## 2013-07-23 MED ORDER — KCL IN DEXTROSE-NACL 20-5-0.45 MEQ/L-%-% IV SOLN
INTRAVENOUS | Status: DC
  Administered 2013-07-23: 21:00:00 via INTRAVENOUS
  Filled 2013-07-23 (×4): qty 1000

## 2013-07-23 MED ORDER — DIPHENHYDRAMINE HCL 50 MG/ML IJ SOLN: 12.5000 mg | Freq: Four times a day (QID) | INTRAMUSCULAR | Status: DC | PRN

## 2013-07-23 MED ORDER — ROCURONIUM BROMIDE 100 MG/10ML IV SOLN
INTRAVENOUS | Status: AC
  Filled 2013-07-23: qty 1

## 2013-07-23 MED ORDER — PROMETHAZINE HCL 25 MG/ML IJ SOLN: 6.2500 mg | INTRAMUSCULAR | Status: DC | PRN

## 2013-07-23 MED ORDER — HYOSCYAMINE SULFATE 0.125 MG SL SUBL
0.1250 mg | SUBLINGUAL_TABLET | SUBLINGUAL | Status: DC | PRN
  Filled 2013-07-23 (×2): qty 1

## 2013-07-23 MED ORDER — OXYCODONE-ACETAMINOPHEN 5-325 MG PO TABS
1.0000 | ORAL_TABLET | ORAL | Status: DC | PRN
  Administered 2013-07-23: 1 via ORAL
  Administered 2013-07-23: 2 via ORAL
  Administered 2013-07-24 (×3): 1 via ORAL
  Filled 2013-07-23: qty 1
  Filled 2013-07-23: qty 2
  Filled 2013-07-23 (×3): qty 1

## 2013-07-23 MED ORDER — MIDAZOLAM HCL 2 MG/2ML IJ SOLN
INTRAMUSCULAR | Status: AC | PRN
  Administered 2013-07-23: 2 mg via INTRAVENOUS
  Administered 2013-07-23: 1 mg via INTRAVENOUS

## 2013-07-23 MED ORDER — LIDOCAINE HCL (CARDIAC) 20 MG/ML IV SOLN
INTRAVENOUS | Status: DC | PRN
  Administered 2013-07-23: 100 mg via INTRAVENOUS

## 2013-07-23 MED ORDER — FENTANYL CITRATE 0.05 MG/ML IJ SOLN
INTRAMUSCULAR | Status: AC
  Filled 2013-07-23: qty 4

## 2013-07-23 MED ORDER — DIPHENHYDRAMINE HCL 12.5 MG/5ML PO ELIX: 12.5000 mg | ORAL_SOLUTION | Freq: Four times a day (QID) | ORAL | Status: DC | PRN

## 2013-07-23 MED ORDER — OXYCODONE HCL 5 MG PO TABS: 5.0000 mg | ORAL_TABLET | Freq: Once | ORAL | Status: DC | PRN

## 2013-07-23 MED ORDER — MIDAZOLAM HCL 2 MG/2ML IJ SOLN
INTRAMUSCULAR | Status: AC
  Filled 2013-07-23: qty 2

## 2013-07-23 MED ORDER — ONDANSETRON HCL 4 MG/2ML IJ SOLN
INTRAMUSCULAR | Status: AC
  Filled 2013-07-23: qty 2

## 2013-07-23 MED ORDER — ONDANSETRON HCL 4 MG/2ML IJ SOLN
INTRAMUSCULAR | Status: DC | PRN
  Administered 2013-07-23: 4 mg via INTRAVENOUS

## 2013-07-23 MED ORDER — CIPROFLOXACIN IN D5W 400 MG/200ML IV SOLN
INTRAVENOUS | Status: AC
  Filled 2013-07-23: qty 200

## 2013-07-23 MED ORDER — CIPROFLOXACIN IN D5W 400 MG/200ML IV SOLN: 400.0000 mg | Freq: Once | INTRAVENOUS | Status: DC

## 2013-07-23 MED ORDER — HYDROMORPHONE HCL PF 1 MG/ML IJ SOLN
0.2500 mg | INTRAMUSCULAR | Status: DC | PRN
  Administered 2013-07-23 (×2): 0.5 mg via INTRAVENOUS

## 2013-07-23 MED ORDER — CIPROFLOXACIN IN D5W 400 MG/200ML IV SOLN
400.0000 mg | INTRAVENOUS | Status: AC
  Administered 2013-07-23: 400 mg via INTRAVENOUS

## 2013-07-23 MED ORDER — ACETAMINOPHEN 325 MG PO TABS: 650.0000 mg | ORAL_TABLET | ORAL | Status: DC | PRN

## 2013-07-23 SURGICAL SUPPLY — 46 items
BAG URINE DRAINAGE (UROLOGICAL SUPPLIES) IMPLANT
BAG URO CATCHER STRL LF (DRAPE) IMPLANT
BASKET ZERO TIP NITINOL 2.4FR (BASKET) IMPLANT
BENZOIN TINCTURE PRP APPL 2/3 (GAUZE/BANDAGES/DRESSINGS) IMPLANT
BLADE SURG 15 STRL LF DISP TIS (BLADE) ×1 IMPLANT
BLADE SURG 15 STRL SS (BLADE) ×2
CARTRIDGE STONEBREAK CO2 KIDNE (ELECTROSURGICAL) ×3 IMPLANT
CATH AINSWORTH 30CC 24FR (CATHETERS) ×3 IMPLANT
CATH ROBINSON RED A/P 20FR (CATHETERS) IMPLANT
CATH URET 5FR 28IN OPEN ENDED (CATHETERS) IMPLANT
CATH URET DUAL LUMEN 6-10FR 50 (CATHETERS) ×3 IMPLANT
CATH X-FORCE N30 NEPHROSTOMY (TUBING) ×3 IMPLANT
COVER SURGICAL LIGHT HANDLE (MISCELLANEOUS) ×3 IMPLANT
DRAPE C-ARM 42X120 X-RAY (DRAPES) ×3 IMPLANT
DRAPE CAMERA CLOSED 9X96 (DRAPES) ×3 IMPLANT
DRAPE LINGEMAN PERC (DRAPES) ×3 IMPLANT
DRAPE SURG IRRIG POUCH 19X23 (DRAPES) ×3 IMPLANT
DRSG TEGADERM 8X12 (GAUZE/BANDAGES/DRESSINGS) ×6 IMPLANT
FIBER LASER FLEXIVA 550 (UROLOGICAL SUPPLIES) IMPLANT
GLOVE SURG SS PI 8.0 STRL IVOR (GLOVE) ×3 IMPLANT
GOWN STRL REUS W/TWL LRG LVL3 (GOWN DISPOSABLE) ×6 IMPLANT
GOWN STRL REUS W/TWL XL LVL3 (GOWN DISPOSABLE) IMPLANT
KIT BASIN OR (CUSTOM PROCEDURE TRAY) ×3 IMPLANT
LASER FIBER DISP 1000U (UROLOGICAL SUPPLIES) IMPLANT
MANIFOLD NEPTUNE II (INSTRUMENTS) ×3 IMPLANT
NS IRRIG 1000ML POUR BTL (IV SOLUTION) IMPLANT
PACK BASIC VI WITH GOWN DISP (CUSTOM PROCEDURE TRAY) ×3 IMPLANT
PACK CYSTO (CUSTOM PROCEDURE TRAY) IMPLANT
PAD ABD 7.5X8 STRL (GAUZE/BANDAGES/DRESSINGS) ×6 IMPLANT
PROBE KIDNEY STONEBRKR 2.0X425 (ELECTROSURGICAL) ×3 IMPLANT
PROBE LITHOCLAST ULTRA 3.8X403 (UROLOGICAL SUPPLIES) IMPLANT
PROBE PNEUMATIC 1.0MMX570MM (UROLOGICAL SUPPLIES) IMPLANT
PROBE PNEUMATIC 1.0X500 (ELECTROSURGICAL) IMPLANT
SET IRRIG Y TYPE TUR BLADDER L (SET/KITS/TRAYS/PACK) ×3 IMPLANT
SET WARMING FLUID IRRIGATION (MISCELLANEOUS) IMPLANT
SPONGE GAUZE 4X4 12PLY (GAUZE/BANDAGES/DRESSINGS) IMPLANT
SPONGE LAP 4X18 X RAY DECT (DISPOSABLE) ×3 IMPLANT
STONE CATCHER W/TUBE ADAPTER (UROLOGICAL SUPPLIES) IMPLANT
SUT SILK 2 0 30  PSL (SUTURE) ×2
SUT SILK 2 0 30 PSL (SUTURE) ×1 IMPLANT
SYR 20CC LL (SYRINGE) ×3 IMPLANT
SYRINGE 10CC LL (SYRINGE) ×3 IMPLANT
TOWEL OR NON WOVEN STRL DISP B (DISPOSABLE) ×3 IMPLANT
TRAY FOLEY BAG SILVER LF 16FR (CATHETERS) ×3 IMPLANT
TUBING CONNECTING 10 (TUBING) ×4 IMPLANT
TUBING CONNECTING 10' (TUBING) ×2

## 2013-07-23 NOTE — Progress Notes (Signed)
Patient ID: Shawn Gay, male   DOB: 06/20/1975, 38 y.o.   MRN: [id number]   He is having some surgical site pain as expected and his urine is bloody as expected but he is otherwise doing well.

## 2013-07-23 NOTE — Transfer of Care (Signed)
Immediate Anesthesia Transfer of Care Note  Patient: Shawn Gay  Procedure(s) Performed: Procedure(s): LEFT NEPHROLITHOTOMY PERCUTANEOUS (Left)  Patient Location: PACU  Anesthesia Type:General  Level of Consciousness: sedated  Airway & Oxygen Therapy: Patient Spontanous Breathing and Patient connected to face mask oxygen  Post-op Assessment: Report given to PACU RN and Post -op Vital signs reviewed and stable  Post vital signs: Reviewed and stable  Complications: No apparent anesthesia complications

## 2013-07-23 NOTE — Anesthesia Postprocedure Evaluation (Signed)
Anesthesia Post Note  Patient: Shawn ForestJoshua M Munter  Procedure(s) Performed: Procedure(s) (LRB): LEFT NEPHROLITHOTOMY PERCUTANEOUS (Left)  Anesthesia type: General  Patient location: PACU  Post pain: Pain level controlled  Post assessment: Post-op Vital signs reviewed  Last Vitals: BP 171/95  Pulse 80  Temp(Src) 36.7 C  Resp 20  SpO2 100%  Post vital signs: Reviewed  Level of consciousness: sedated  Complications: No apparent anesthesia complications

## 2013-07-23 NOTE — H&P (View-Only) (Signed)
   Subjective: Shawn Gay is a 38yo WM who was seen at [facility] over night for left flank pain.  The pain began yesterday and was severe.  The pain was associated with nausea and vomiting.  He was treated with toradol and hydromorphone and a CT was obtained that demonstrated a 1.6cm stone at the left UPJ with obstruction.  Shawn Gay had a similar but less severe episode of pain in December and reports that this stone has been present for some time.   He has a  History of stones and a left UPJ obstruction and has had prior lithotripsy, ureteroscopy, stents and an endopyelotomy.  He has no voiding complaints or hematuria.  He is currently pain free.  ROS:  Review of Systems  Constitutional: Positive for diaphoresis. Negative for fever and chills.  Respiratory: Negative for shortness of breath.   Cardiovascular: Negative for chest pain and leg swelling.  Gastrointestinal: Positive for nausea, vomiting and abdominal pain. Negative for diarrhea and constipation.  Genitourinary: Positive for flank pain. Negative for dysuria, urgency and hematuria.  All other systems reviewed and are negative.    Anti-infectives: Anti-infectives   None      Current Facility-Administered Medications  Medication Dose Route Frequency Provider Last Rate Last Dose  . 0.9 %  sodium chloride infusion   Intravenous Continuous Kaydn Kumpf, MD 75 mL/hr at 07/20/13 0630    . HYDROmorphone (DILAUDID) injection 1 mg  1 mg Intravenous Q4H PRN Ankit Nanavati, MD      . ondansetron (ZOFRAN) injection 4 mg  4 mg Intravenous Q8H PRN Ankit Nanavati, MD       Home Meds: Synthroid.  Allergies  Allergen Reactions  . Gluten Meal Other (See Comments)    Gas /pain    Past Medical History  Diagnosis Date  . Kidney stone   . Hypothyroid   . UPJ obstruction, congenital   . Gluten intolerance     Past Surgical History  Procedure Laterality Date  . Lithotripsy    . Ureteroscopy    . Endopyelotomy    . Finger  amputation      History   Social History  . Marital Status: Single    Spouse Name: N/A    Number of Children: N/A  . Years of Education: N/A   Occupational History  . student    Social History Main Topics  . Smoking status: Former Smoker  . Smokeless tobacco: Not on file  . Alcohol Use: No  . Drug Use: No  . Sexual Activity: Not on file   Other Topics Concern  . Not on file   Social History Narrative  . No narrative on file    Family History  Problem Relation Age of Onset  . Diabetes Paternal Grandfather   . Cancer Paternal Grandfather      Objective: Vital signs in last 24 hours: Temp:  [98.2 F (36.8 C)-98.9 F (37.2 C)] 98.2 F (36.8 C) (04/06 0507) Pulse Rate:  [59-98] 77 (04/06 0507) Resp:  [20-24] 20 (04/06 0507) BP: (121-155)/(73-107) 121/73 mmHg (04/06 0507) SpO2:  [96 %-100 %] 96 % (04/06 0507) Weight:  [108.863 kg (240 lb)-109.5 kg (241 lb 6.5 oz)] 109.5 kg (241 lb 6.5 oz) (04/06 0500)  Intake/Output from previous day: 04/05 0701 - 04/06 0700 In: 870.8 [I.V.:870.8] Out: 125 [Urine:125] Intake/Output this shift:     Physical Exam  Constitutional: He is oriented to person, place, and time and well-developed, well-nourished, and in no distress. No   distress.  HENT:  Head: Normocephalic and atraumatic.  Neck: Normal range of motion. Neck supple. No thyromegaly present.  Cardiovascular: Normal rate and regular rhythm.   No murmur heard. Pulmonary/Chest: Effort normal and breath sounds normal. No respiratory distress.  Abdominal: Soft. Bowel sounds are normal. He exhibits no distension and no mass. There is no tenderness. There is no rebound.  Musculoskeletal: Normal range of motion. He exhibits no edema and no tenderness.  Neurological: He is alert and oriented to person, place, and time.  No focal deficits  Skin: Skin is warm and dry.  Psychiatric: Mood and affect normal.    Lab Results:   Recent Labs  07/19/13 2310  WBC 10.0  HGB 16.4   HCT 45.6  PLT 196   BMET  Recent Labs  07/19/13 2310  NA 144  K 3.8  CL 104  CO2 24  GLUCOSE 114*  BUN 10  CREATININE 0.90  CALCIUM 10.1   PT/INR No results found for this basename: LABPROT, INR,  in the last 72 hours ABG No results found for this basename: PHART, PCO2, PO2, HCO3,  in the last 72 hours  Studies/Results: Ct Abdomen Pelvis Wo Contrast  07/20/2013   CLINICAL DATA:  Left flank pain and left lower quadrant pain with nausea and vomiting.  EXAM: CT ABDOMEN AND PELVIS WITHOUT CONTRAST  TECHNIQUE: Multidetector CT imaging of the abdomen and pelvis was performed following the standard protocol without intravenous contrast.  COMPARISON:  None.  FINDINGS: Lung bases are within normal.  Abdominal images demonstrate minimal fatty infiltration of the liver. The spleen, pancreas, gallbladder and adrenal glands are within normal. The appendix is normal.  Kidneys are normal in size. There is mild left-sided hydronephrosis with a 1.6 cm stone over the left UPJ. Remainder of the ureters are unremarkable. There are no right renal stones.  Pelvic images are unremarkable. Bones soft tissues are within normal.  IMPRESSION: 1.6 cm stone over the left UPJ causing low-grade obstruction.  Mild fatty infiltration of the liver.   Electronically Signed   By: Daniel  Boyle M.D.   On: 07/20/2013 00:01   I have discussed his case with Dr. Molpus and reviewed his labs and CT films and report.   His UA had 7-10 WBC and 21-50 RBC but there were no bacteria or nit.    Assessment: He has a 1.6cm LUPJ stone with some hydronephrosis but a history of a UPJ obstruction which can make the obstruction appear worse than it is.  He is currently pain free.    This episode may have been precipitated by his 4 day drive from [place] and a hike up [place] earlier in the weekend.    Plan: His situation is complicated by the fact that he is in town for 3 months for school and he has had prior issues with stones  and a UPJ obstruction.    I explained the options for treatment including ESWL, ureteroscopy and a percutaneous nephrolithotomy.   I explained that based on his prior surgical history and desire to avoid a stent if possible that a percutaneous nephrolithotomy would be the best option to render him stone free and stent free.   I reviewed the risks of that procedure including bleeding, infection, injury to the kidney and adjacent organs, AV fistula formation, need for secondary procedures, urine leaks, thrombotic events and anesthetic complications.     He had toradol so we can't to a perc acutely or ESWL for that matter   and he is currently pain free.    I will send him home with pain and nausea med and after he discussed the situation with his sister, he wants to go ahead with the PCNL.   CC: Dr. Adam Rhodes,  [facility], [place].     LOS: 1 day    Shawn Gay 07/20/2013  

## 2013-07-23 NOTE — Interval H&P Note (Signed)
History and Physical Interval Note:  Left NT placed today.   07/23/2013 12:58 PM  Shawn BoucheJoshua M Brenning  has presented today for surgery, with the diagnosis of LEFT RENAL STONE  The various methods of treatment have been discussed with the patient and family. After consideration of risks, benefits and other options for treatment, the patient has consented to  Procedure(s): LEFT NEPHROLITHOTOMY PERCUTANEOUS (Left) HOLMIUM LASER APPLICATION (Left) as a surgical intervention .  The patient's history has been reviewed, patient examined, no change in status, stable for surgery.  I have reviewed the patient's chart and labs.  Questions were answered to the patient's satisfaction.     Bjorn PippinJohn Pepper Kerrick

## 2013-07-23 NOTE — Anesthesia Preprocedure Evaluation (Signed)
Anesthesia Evaluation  Patient identified by MRN, date of birth, ID band Patient awake    Reviewed: Allergy & Precautions, H&P , NPO status , Patient's Chart, lab work & pertinent test results  Airway Mallampati: II TM Distance: >3 FB Neck ROM: Full    Dental  (+) Dental Advisory Given   Pulmonary neg pulmonary ROS, former smoker,  breath sounds clear to auscultation        Cardiovascular negative cardio ROS  Rhythm:Regular Rate:Normal     Neuro/Psych negative neurological ROS  negative psych ROS   GI/Hepatic negative GI ROS, Neg liver ROS,   Endo/Other  Hypothyroidism   Renal/GU Renal disease     Musculoskeletal negative musculoskeletal ROS (+)   Abdominal   Peds  Hematology negative hematology ROS (+)   Anesthesia Other Findings   Reproductive/Obstetrics                           Anesthesia Physical Anesthesia Plan  ASA: II  Anesthesia Plan: General   Post-op Pain Management:    Induction: Intravenous  Airway Management Planned: Oral ETT  Additional Equipment:   Intra-op Plan:   Post-operative Plan: Extubation in OR  Informed Consent: I have reviewed the patients History and Physical, chart, labs and discussed the procedure including the risks, benefits and alternatives for the proposed anesthesia with the patient or authorized representative who has indicated his/her understanding and acceptance.   Dental advisory given  Plan Discussed with: CRNA  Anesthesia Plan Comments:         Anesthesia Quick Evaluation

## 2013-07-23 NOTE — Progress Notes (Signed)
Demerol given for shaking and shivering.

## 2013-07-23 NOTE — Brief Op Note (Signed)
07/23/2013  2:10 PM  PATIENT:  Shawn Gay  38 y.o. male  PRE-OPERATIVE DIAGNOSIS:  LEFT RENAL STONE 1.6cm   POST-OPERATIVE DIAGNOSIS:  LEFT RENAL STONE  PROCEDURE:  Procedure(s): LEFT NEPHROLITHOTOMY PERCUTANEOUS (Left)  SURGEON:  Surgeon(s) and Role:    * Bjorn PippinJohn Anjolaoluwa Siguenza, MD - Primary  PHYSICIAN ASSISTANT:   ASSISTANTS: none   ANESTHESIA:   general  EBL:  Total I/O In: 1000 [I.V.:1000] Out: -   BLOOD ADMINISTERED:none  DRAINS: Urinary Catheter (Foley) and 10224fr A[place] Nephrostomy and 606fr safety catheter   LOCAL MEDICATIONS USED:  NONE  SPECIMEN:  Source of Specimen:  stone from right kidney  DISPOSITION OF SPECIMEN:  to patient to bring to office  COUNTS:  YES  TOURNIQUET:  * No tourniquets in log *  DICTATION: .Other Dictation: Dictation Number [id number]  PLAN OF CARE: Admit for overnight observation  PATIENT DISPOSITION:  PACU - hemodynamically stable.   Delay start of Pharmacological VTE agent (>24hrs) due to surgical blood loss or risk of bleeding: yes

## 2013-07-23 NOTE — Progress Notes (Signed)
Patient spoke with Dr.Wrenn via portable phone regarding questions/concens for procedure today.  Md answered questions and pt is ready to proceeed.

## 2013-07-23 NOTE — Progress Notes (Signed)
Shaking and shivering stopped. 

## 2013-07-23 NOTE — H&P (Signed)
Chief Complaint: "I am here for a procedure for my kidney stone." Referring Physician: Dr. Annabell Howells HPI: Shawn Gay is an 38 y.o. male with history of a kidney stones s/p prior lithotripsy. He presented to ED on 07/19/13 complaining of left flank pain, CT revealed 1.6 cm stone over the left UPJ causing low-grade obstruction. He has been scheduled today for an image guided left percutaneous nephrostomy tube placement prior to his percutaneous nephrolithotomy. He denies any chest pain, shortness of breath or palpitations. He denies any active signs of bleeding or excessive bruising. He denies any recent fever or chills. The patient denies any history of sleep apnea or chronic oxygen use. He has previously tolerated anesthesia without complications. He admits to 0.5/10 left flank pain today without gross hematuria.   Past Medical History:  Past Medical History  Diagnosis Date  . Kidney stone   . Hypothyroid   . UPJ obstruction, congenital   . Gluten intolerance     Past Surgical History:  Past Surgical History  Procedure Laterality Date  . Lithotripsy  2003 or 2004    x 2  . Ureteroscopy  2003 or 2003    x 1  . 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 Diabetes Paternal Grandfather   . Cancer Paternal Grandfather     Social History:  reports that he has quit smoking. His smoking use included Cigarettes. He smoked 0.00 packs per day. He does not have any smokeless tobacco history on file. He reports that he does not drink alcohol or use illicit drugs.  Allergies:  Allergies  Allergen Reactions  . Gluten Meal Other (See Comments)    Gas /pain    Medications:   Medication List    Notice   This visit is during an admission. Changes to the med list made in this visit will be reflected in the After Visit Summary of the admission.     Please HPI for pertinent positives, otherwise complete 10 system ROS  negative.  Physical Exam: BP 136/96  Pulse 71  Temp(Src) 98.6 F (37 C) (Oral)  Resp 18  SpO2 97% There is no weight on file to calculate BMI.  General Appearance:  Alert, cooperative, no distress  Head:  Normocephalic, without obvious abnormality, atraumatic  Neck: Supple, symmetrical, trachea midline  Lungs:   Clear to auscultation bilaterally, no w/r/r, respirations unlabored without use of accessory muscles.  Chest Wall:  No tenderness or deformity  Heart:  Regular rate and rhythm, S1, S2 normal, no murmur, rub or gallop.  Abdomen:   Soft, non-tender, non distended, (+) BS  Extremities: Extremities normal, atraumatic, no cyanosis or edema  Pulses: 2+ and symmetric  Neurologic: Normal affect, no gross deficits.   Results for orders placed during the hospital encounter of 07/22/13 (from the past 48 hour(s))  APTT     Status: None   Collection Time    07/22/13  1:50 PM      Result Value Ref Range   aPTT 30  24 - 37 seconds  PROTIME-INR     Status: None   Collection Time    07/22/13  1:50 PM      Result Value Ref Range   Prothrombin Time 13.3  11.6 - 15.2 seconds   INR 1.03  0.00 - 1.49  TYPE AND SCREEN     Status: None   Collection Time  07/22/13  1:50 PM      Result Value Ref Range   ABO/RH(D) O POS     Antibody Screen NEG     Sample Expiration 07/26/2013    ABO/RH     Status: None   Collection Time    07/22/13  1:50 PM      Result Value Ref Range   ABO/RH(D) O POS     No results found.  Assessment/Plan Left 1.6 cm UPJ stone with obstruction. Scheduled today for image guided left percutaneous nephrostomy tube placement prior to percutaneous nephrolithotomy.  Patient has been NPO, no blood thinners, afebrile, cipro ordered, labs and images reviewed. Risks and Benefits discussed with the patient. All of the patient's questions were answered, patient is agreeable to proceed. Consent signed and in chart.   Berneta LevinsKoreen D Shantina Chronister PA-C 07/23/2013, 9:59 AM

## 2013-07-23 NOTE — Discharge Instructions (Signed)
Percutaneous Nephrolithotomy °Kidney stones can cause a great deal of pain. They can block urine from leaving the body. And they can lead to infection. Kidney stones might pass on their own, or they can be broken up by shock waves from a special machine. But sometimes surgery is needed to get rid of kidney stones. One type of surgery is called percutaneous nephrolithotomy. "Nephro" means kidney, "litho" means stone and "tomy" means removal by surgery. "Percutaneous" means through the skin. This type of surgery needs only a small cut (incision) in the skin. °This surgery may be suggested for various reasons. They include: °· The kidney stones are 2 centimeters wide (about 3/4 inch) or bigger. They also might be oddly shaped. °· Other treatments were tried, but all or some of the kidney stones remain. °· Infection has developed. °· No other treatment can be used. °LET YOUR CAREGIVER KNOW ABOUT:  °· Any allergies. °· All medications you are taking, including: °· Herbs, eyedrops, over-the-counter medications and creams. °· Blood thinners (anticoagulants), aspirin or other drugs that could affect blood clotting. °· Use of steroids (by mouth or as creams). °· Previous problems with anesthetics, including local anesthetics. °· Possibility of pregnancy, if this applies. °· Any history of blood clots. °· Any history of bleeding or other blood problems. °· Previous surgery. °· Smoking history. °· Other health problems. °RISKS AND COMPLICATIONS  °· During surgery: °· Sometimes all the kidney stones cannot be removed through the tube. Then, the percutaneous nephrolithotomy procedure would be stopped. Open surgery would be used to remove the remaining stones. °· Short-term risks from the surgery could include: °· Excessive bleeding. °· Blood in your urine. °· Holes in the kidney. These usually heal on their own. °· Pain. °· Redness or tenderness at the incision site. °· Numbness (loss of feeling) in the area treated. Tingling  also is possible. °· A pooling of blood in the wound (hematoma). °· Infection. °· Slow healing. °· Longer-term possibilities include: °· Kidney damage. °· Damage to organs near the kidney. °· Need for a repeat surgery. °BEFORE THE PROCEDURE °· You may need to take some tests before your surgery. These might include: °· Blood tests. °· Urine tests. °· Tests to make sure your heart is working properly. °· Let your healthcare provider know if you think you might have a urinary tract infection. You will probably need to take an antibiotic to treat this before the surgery. °· Two weeks before your surgery, stop using aspirin and non-steroidal anti-inflammatory drugs (NSAIDs) for pain relief. This includes prescription drugs and over-the-counter drugs such as ibuprofen and naproxen. Also stop taking vitamin E. °· If you take blood-thinners, ask your healthcare provider when you should stop taking them. °· Do not eat or drink for about 8 hours before your surgery. °· You might be asked to shower or wash with a special antibacterial soap before the procedure. °· Arrive at least an hour before the surgery, or whenever your surgeon recommends. This will give you time to check in and fill out any needed paperwork. °PROCEDURE °· The preparation: °· You will change into a hospital gown. °· You will be given an IV. A needle will be inserted in your arm. Medication will be able to flow directly into your body through this needle. °· You might be given a sedative. This medication will help you relax. °· You may be given a general anesthetic (a drug that will put you to sleep during the surgery). Or, you may   get a local anesthetic (part of your body will be numb, but you will remain awake). °· A catheter (tube) will be put in your bladder to drain urine during and after surgery. °· The procedure: °· The surgeon will make a small incision in your lower back. °· A tube will be inserted through the incision into your kidney. °· Each  kidney stone is removed through this tube. Larger stones may first be broken up with a laser (a high-intensity light beam) or other tools. °· If a kidney stone has already left the kidney, the surgeon would use a special tool to bring it back in. Then it would be removed through the tube. °· After all the stones are taken out, a catheter will be put in. Fluid can build up around the kidney as it heals. The catheter lets this fluid drain out of the body. °· A dressing (medicine and bandage) will be put on the incision area. °· The surgery usually takes three to four hours. °AFTER THE PROCEDURE °· You will stay in a recovery area until the anesthesia has worn off. Your blood pressure and pulse will be checked often. You might be given more pain medication. °· You will be moved to a hospital room for the rest of your stay. °· The catheter that is taking urine out of your body will be taken out within 24 hours. °· The day after your surgery, you should be able to walk around. Walking helps prevent blood clots (thick clumps that can block the flow of blood). °· You may be asked to do some breathing exercises. °· You will be able to have only liquids for a day or two. °· Before you go home: °· The catheter that is draining fluid from the kidney area is usually taken out. °· You will be taught how to care for the incision. Be sure to ask how often the dressing should be changed and when it can get wet. °· You also will be told what you should and should not do while your kidney and incisions heal. For instance, you may be urged to walk to prevent blood clots. °Document Released: 01/28/2009 Document Revised: 06/25/2011 Document Reviewed: 01/28/2009 °Exit[employer]® Patient Information ©2014 [employer]. ° °

## 2013-07-24 ENCOUNTER — Observation Stay (HOSPITAL_COMMUNITY): Payer: 59

## 2013-07-24 ENCOUNTER — Emergency Department (HOSPITAL_BASED_OUTPATIENT_CLINIC_OR_DEPARTMENT_OTHER)
Admission: EM | Admit: 2013-07-24 | Discharge: 2013-07-25 | Disposition: A | Discharge status: Home / Self Care | Payer: 59 | Attending: Emergency Medicine | Admitting: Emergency Medicine

## 2013-07-24 ENCOUNTER — Encounter (HOSPITAL_COMMUNITY): Payer: Self-pay | Admitting: Urology

## 2013-07-24 DIAGNOSIS — E039 Hypothyroidism, unspecified: Secondary | ICD-10-CM | POA: Insufficient documentation

## 2013-07-24 DIAGNOSIS — Q6211 Congenital occlusion of ureteropelvic junction: Secondary | ICD-10-CM

## 2013-07-24 DIAGNOSIS — R109 Unspecified abdominal pain: Secondary | ICD-10-CM | POA: Insufficient documentation

## 2013-07-24 DIAGNOSIS — Z87891 Personal history of nicotine dependence: Secondary | ICD-10-CM | POA: Insufficient documentation

## 2013-07-24 DIAGNOSIS — Q6239 Other obstructive defects of renal pelvis and ureter: Secondary | ICD-10-CM | POA: Insufficient documentation

## 2013-07-24 DIAGNOSIS — G8918 Other acute postprocedural pain: Secondary | ICD-10-CM

## 2013-07-24 DIAGNOSIS — N200 Calculus of kidney: Secondary | ICD-10-CM | POA: Insufficient documentation

## 2013-07-24 DIAGNOSIS — K900 Celiac disease: Secondary | ICD-10-CM | POA: Insufficient documentation

## 2013-07-24 LAB — CBC WITH DIFFERENTIAL/PLATELET
BASOS ABS: 0 10*3/uL (ref 0.0–0.1)
BASOS PCT: 0 % (ref 0–1)
Eosinophils Absolute: 0 10*3/uL (ref 0.0–0.7)
Eosinophils Relative: 0 % (ref 0–5)
HEMATOCRIT: 44.7 % (ref 39.0–52.0)
HEMOGLOBIN: 16 g/dL (ref 13.0–17.0)
Lymphocytes Relative: 14 % (ref 12–46)
Lymphs Abs: 1.5 10*3/uL (ref 0.7–4.0)
MCH: 31.1 pg (ref 26.0–34.0)
MCHC: 35.8 g/dL (ref 30.0–36.0)
MCV: 87 fL (ref 78.0–100.0)
MONO ABS: 0.9 10*3/uL (ref 0.1–1.0)
Monocytes Relative: 8 % (ref 3–12)
NEUTROS PCT: 78 % — AB (ref 43–77)
Neutro Abs: 8.9 10*3/uL — ABNORMAL HIGH (ref 1.7–7.7)
Platelets: 168 10*3/uL (ref 150–400)
RBC: 5.14 MIL/uL (ref 4.22–5.81)
RDW: 13 % (ref 11.5–15.5)
WBC: 11.4 10*3/uL — AB (ref 4.0–10.5)

## 2013-07-24 LAB — COMPREHENSIVE METABOLIC PANEL
ALBUMIN: 4.4 g/dL (ref 3.5–5.2)
ALK PHOS: 49 U/L (ref 39–117)
ALT: 47 U/L (ref 0–53)
AST: 35 U/L (ref 0–37)
BILIRUBIN TOTAL: 0.7 mg/dL (ref 0.3–1.2)
BUN: 14 mg/dL (ref 6–23)
CO2: 24 meq/L (ref 19–32)
Calcium: 9.3 mg/dL (ref 8.4–10.5)
Chloride: 105 mEq/L (ref 96–112)
Creatinine, Ser: 1.1 mg/dL (ref 0.50–1.35)
GFR calc Af Amer: >90 mL/min (ref >90)
GFR, EST NON AFRICAN AMERICAN: 85 mL/min — AB (ref >90)
Glucose, Bld: 130 mg/dL — ABNORMAL HIGH (ref 70–99)
POTASSIUM: 3.9 meq/L (ref 3.7–5.3)
Sodium: 143 mEq/L (ref 137–147)
Total Protein: 6.9 g/dL (ref 6.0–8.3)

## 2013-07-24 LAB — URINE MICROSCOPIC-ADD ON

## 2013-07-24 LAB — URINALYSIS, ROUTINE W REFLEX MICROSCOPIC
Bilirubin Urine: NEGATIVE
GLUCOSE, UA: NEGATIVE mg/dL
KETONES UR: NEGATIVE mg/dL
Nitrite: NEGATIVE
Protein, ur: 100 mg/dL — AB
Specific Gravity, Urine: 1.021 (ref 1.005–1.030)
UROBILINOGEN UA: 1 mg/dL (ref 0.0–1.0)
pH: 6 (ref 5.0–8.0)

## 2013-07-24 LAB — HEMOGLOBIN AND HEMATOCRIT, BLOOD
HCT: 45.2 % (ref 39.0–52.0)
Hemoglobin: 16.5 g/dL (ref 13.0–17.0)

## 2013-07-24 MED ORDER — DOCUSATE SODIUM 100 MG PO CAPS: 100.0000 mg | ORAL_CAPSULE | Freq: Two times a day (BID) | ORAL | Status: AC

## 2013-07-24 MED ORDER — HYDROMORPHONE HCL PF 1 MG/ML IJ SOLN
1.0000 mg | INTRAMUSCULAR | Status: AC | PRN
  Administered 2013-07-24 (×3): 1 mg via INTRAVENOUS
  Filled 2013-07-24 (×3): qty 1

## 2013-07-24 MED ORDER — ONDANSETRON HCL 4 MG/2ML IJ SOLN
4.0000 mg | Freq: Once | INTRAMUSCULAR | Status: AC
  Administered 2013-07-24: 4 mg via INTRAVENOUS
  Filled 2013-07-24: qty 2

## 2013-07-24 NOTE — ED Notes (Signed)
Pt. Report given to [facility]

## 2013-07-24 NOTE — ED Provider Notes (Signed)
CSN: [id number]     Arrival date & time 07/24/13  2104 History  This chart was scribed for Celene Kras, MD by Beverly Milch, ED Scribe. This patient was seen in room MH06/MH06 and the patient's care was started at 10:01 PM.     Chief Complaint  Patient presents with  . Back Pain      The history is provided by the patient. No language interpreter was used.   HPI Comments: Shawn Gay is a 38 y.o. male who presents to the Emergency Department complaining of left flank pain after being discharged from [facility] for a kidney stone removal surgery yesterday. He states they did put in a call to his urologist. The flank pain has been gradually worsening and states it "feels like something is stuck and the pressure is building." Pt denies drains in place and states he has only a bandage.    Past Medical History  Diagnosis Date  . Kidney stone   . Hypothyroid   . UPJ obstruction, congenital   . Gluten intolerance     Past Surgical History  Procedure Laterality Date  . Lithotripsy  2003 or 2004    x 2  . Ureteroscopy  2003 or 2003    x 1  . 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 Nephrolithotomy Left 07/23/2013    Procedure: LEFT NEPHROLITHOTOMY PERCUTANEOUS;  Surgeon: Bjorn Pippin, MD;  Location: [facility];  Service: Urology;  Laterality: Left;    Family History  Problem Relation Age of Onset  . Diabetes Paternal Grandfather   . Cancer Paternal Grandfather     History  Substance Use Topics  . Smoking status: Former Smoker    Types: Cigarettes  . Smokeless tobacco: Not on file     Comment: social only in high school  . Alcohol Use: No    Review of Systems  Genitourinary: Positive for hematuria and flank pain (left).  All other systems reviewed and are negative.     Allergies  Gluten meal  Home Medications   Current Outpatient Rx  Name  Route  Sig  Dispense  Refill  . Digestive Enzymes (DIGESTIVE ENZYME PO)   Oral   Take 1 tablet by mouth  daily.         Marland Kitchen docusate sodium (COLACE) 100 MG capsule   Oral   Take 1 capsule (100 mg total) by mouth 2 (two) times daily.   60 capsule   1   . levothyroxine (SYNTHROID, LEVOTHROID) 100 MCG tablet   Oral   Take 100 mcg by mouth daily before breakfast.         . ondansetron (ZOFRAN ODT) 4 MG disintegrating tablet   Oral   Take 1 tablet (4 mg total) by mouth every 8 (eight) hours as needed for nausea or vomiting.   20 tablet   0   . OVER THE COUNTER MEDICATION   Oral   Take 1 tablet by mouth daily.         Marland Kitchen OVER THE COUNTER MEDICATION   Oral   Take 1 tablet by mouth daily. MINREX         . OVER THE COUNTER MEDICATION   Oral   Take 1 tablet by mouth daily. CLEANZYNE         . oxyCODONE-acetaminophen (ROXICET) 5-325 MG per tablet   Oral   Take 1 tablet by mouth every 4 (four) hours as needed for severe pain.  30 tablet   0   . Probiotic Product (PROBIOTIC PO)   Oral   Take 1 tablet by mouth daily.         Marland Kitchen VITAMIN D-VITAMIN K PO   Oral   Take 1 tablet by mouth daily.          Triage Vitals: BP 148/92  Pulse 80  Temp(Src) 99.6 F (37.6 C) (Oral)  Resp 18  Ht 5\' 8"  (1.727 m)  Wt 240 lb (108.863 kg)  BMI 36.50 kg/m2  SpO2 97%   Physical Exam  Nursing note and vitals reviewed. Constitutional: He appears well-developed and well-nourished. He appears distressed.  HENT:  Head: Normocephalic and atraumatic.  Right Ear: External ear normal.  Left Ear: External ear normal.  Eyes: Conjunctivae are normal. Right eye exhibits no discharge. Left eye exhibits no discharge. No scleral icterus.  Neck: Neck supple. No tracheal deviation present.  Cardiovascular: Normal rate, regular rhythm and intact distal pulses.   Pulmonary/Chest: Effort normal and breath sounds normal. No stridor. No respiratory distress. He has no wheezes. He has no rales.  Abdominal: Soft. Bowel sounds are normal. He exhibits no distension. There is tenderness (LUQ and CVA).  There is no rebound and no guarding.  surgical wound without erythema, discharge, or bleeding in the left costovertebral area  Musculoskeletal: He exhibits no edema and no tenderness.  Neurological: He is alert. He has normal strength. No cranial nerve deficit (no facial droop, extraocular movements intact, no slurred speech) or sensory deficit. He exhibits normal muscle tone. He displays no seizure activity. Coordination normal.  Skin: Skin is warm and dry. No rash noted.  Psychiatric: He has a normal mood and affect.    ED Course  Procedures (including critical care time)   DIAGNOSTIC STUDIES: Oxygen Saturation is 97% on RA, adequate by my interpretation.     COORDINATION OF CARE: 10:05 PM- Pt advised of plan for treatment and pt agrees.  Labs Review Labs Reviewed  URINALYSIS, ROUTINE W REFLEX MICROSCOPIC - Abnormal; Notable for the following:    Color, Urine RED (*)    APPearance TURBID (*)    Hgb urine dipstick LARGE (*)    Protein, ur 100 (*)    Leukocytes, UA SMALL (*)    All other components within normal limits  URINE MICROSCOPIC-ADD ON - Abnormal; Notable for the following:    Squamous Epithelial / LPF FEW (*)    All other components within normal limits   Imaging Review Abd 1 View (kub)  07/24/2013   CLINICAL DATA:  Followup left percutaneous nephrolithotomy  EXAM: ABDOMEN - 1 VIEW  COMPARISON:  07/23/2013  FINDINGS: Scattered large and small bowel gas is identified. A large bore left nephrostomy catheter is seen as well as a smaller directional catheter extending into the distal left ureter. No definitive retained calculi are identified. Correlation with the findings of nephrolithotomy are recommended. The bony structures are within normal limits.  IMPRESSION: No definitive retained calculi are seen. Nephrostomy and ureteral catheters are noted.   Electronically Signed   By: Alcide Clever M.D.   On: 07/24/2013 07:44   Ir US Guide Bx Asp/drain  07/23/2013   CLINICAL DATA:   Calculus centrally in the left renal collecting system near the UPJ. Planned intraoperative nephrolithotomy  EXAM: LEFT PERCUTANEOUS NEPHROURETERAL CATHETER PLACEMENT UNDER ULTRASOUND AND FLUOROSCOPIC GUIDANCE  FLUOROSCOPY TIME:  60 seconds  TECHNIQUE: The procedure, risks (including but not limited to bleeding, infection, organ damage ), benefits, and alternatives were  explained to the patient. Questions regarding the procedure were encouraged and answered. The patient understands and consents to the procedure. Leftflank region prepped with Betadine, draped in usual sterile fashion, infiltrated locally with 1% lidocaine.As antibiotic prophylaxis, Cipro 400 mg IV was ordered pre-procedure and administered intravenously within one hour of incision.  Intravenous Fentanyl and Versed were administered as conscious sedation during continuous cardiorespiratory monitoring by the radiology RN, with a total moderate sedation time of 10 minutes.  Under real-time ultrasound guidance, a 21-gauge trocar needle was advanced into a posterior lower pole calyx. Ultrasound image documentation was saved. Small contrast injection under fluoroscopy confirms appropriate positioning. Needle was exchanged over a guidewire for transitional dilator. Dilator was exchanged over a guidewire for a 5 [place] Kumpe catheter, negotiated down the ureter and coiled in the urinary bladder. Catheter was capped and secured externally. No immediate complication.  IMPRESSION: 1. Technically successful left percutaneous nephroureteral catheter placement in preparation for intraoperative nephrolithotomy.   Electronically Signed   By: Oley Balmaniel  Hassell M.D.   On: 07/23/2013 14:56   Dg C-arm 1-60 Min-no Report  07/23/2013   CLINICAL DATA:  Percutaneous nephrolithotomy.  EXAM: DG C-ARM 1-60 MIN - NRPT [facility]  COMPARISON:  None.  FINDINGS: Six fluoro graphic images demonstrate access of the left intrarenal collecting system with passage of the nephro ureteral  wire. There is a filling defect in the distended renal pelvis consistent a large stone.  IMPRESSION: For graphic images from a left percutaneous nephrolithotomy procedure. Please refer to the procedure report for complete description.   Electronically Signed   By: Amie Portlandavid  Ormond M.D.   On: 07/23/2013 14:45   Ir [place] AbtsIntro Uret Cath Perc Left  07/23/2013   CLINICAL DATA:  Calculus centrally in the left renal collecting system near the UPJ. Planned intraoperative nephrolithotomy  EXAM: LEFT PERCUTANEOUS NEPHROURETERAL CATHETER PLACEMENT UNDER ULTRASOUND AND FLUOROSCOPIC GUIDANCE  FLUOROSCOPY TIME:  60 seconds  TECHNIQUE: The procedure, risks (including but not limited to bleeding, infection, organ damage ), benefits, and alternatives were explained to the patient. Questions regarding the procedure were encouraged and answered. The patient understands and consents to the procedure. Leftflank region prepped with Betadine, draped in usual sterile fashion, infiltrated locally with 1% lidocaine.As antibiotic prophylaxis, Cipro 400 mg IV was ordered pre-procedure and administered intravenously within one hour of incision.  Intravenous Fentanyl and Versed were administered as conscious sedation during continuous cardiorespiratory monitoring by the radiology RN, with a total moderate sedation time of 10 minutes.  Under real-time ultrasound guidance, a 21-gauge trocar needle was advanced into a posterior lower pole calyx. Ultrasound image documentation was saved. Small contrast injection under fluoroscopy confirms appropriate positioning. Needle was exchanged over a guidewire for transitional dilator. Dilator was exchanged over a guidewire for a 5 [place] Kumpe catheter, negotiated down the ureter and coiled in the urinary bladder. Catheter was capped and secured externally. No immediate complication.  IMPRESSION: 1. Technically successful left percutaneous nephroureteral catheter placement in preparation for intraoperative  nephrolithotomy.   Electronically Signed   By: Oley Balmaniel  Hassell M.D.   On: 07/23/2013 14:56   Medications  HYDROmorphone (DILAUDID) injection 1 mg (1 mg Intravenous Given 07/24/13 2237)  ondansetron (ZOFRAN) injection 4 mg (4 mg Intravenous Given 07/24/13 2218)     MDM   Final diagnoses:  Post-operative pain   The patient presents with severe left flank pain following his surgery today. There is the possibility of obstruction. I spoke with Dr. Margarita GrizzleWoodruff. He recommends a renal ultrasound which I do  not have the capability of performing at this facility. I discussed the options of CT scan here versus transfer to [facility] ED to continue his evaluation. Patient would prefer to be transferred to [facility]. Patient will need a renal ultrasound. I discussed the case with Dr. Silverio Lay.  Dr. Margarita Grizzle plans on seeing the patient in the emergency department at [facility] long period. I discussed the findings and plan with the patient and his family  I personally performed the services described in this documentation, which was scribed in my presence.  The recorded information has been reviewed and is accurate.    Celene Kras, MD 07/24/13 [phone]

## 2013-07-24 NOTE — Discharge Summary (Signed)
Physician Discharge Summary  Patient ID: Shawn Gay MRN: [id number] DOB/AGE: Jun 16, 1975 38 y.o.  Admit date: 07/23/2013 Discharge date: 07/24/2013  Admission Diagnoses:  Left nephrolithiasis  Discharge Diagnoses:  Principal Problem:   Left nephrolithiasis   Past Medical History  Diagnosis Date  . Kidney stone   . Hypothyroid   . UPJ obstruction, congenital   . Gluten intolerance     Surgeries: Procedure(s): LEFT NEPHROLITHOTOMY PERCUTANEOUS on 07/23/2013   Consultants (if any):    Discharged Condition: Improved  Hospital Course: Shawn Gay is an 38 y.o. male who was admitted 07/23/2013 with a diagnosis of Left nephrolithiasis and went to the operating room on 07/23/2013 and underwent the above named procedures.   He did well post op but was anxious.  On 4/10 a KUB showed no significant residual stone fragments and the NT which was draining light pink urine was removed along with the safety catheter.  A new dressing was applied and he was discharged home later in the day after IV removal.   He was given perioperative antibiotics:  Anti-infectives   Start     Dose/Rate Route Frequency Ordered Stop   07/23/13 2000  ciprofloxacin (CIPRO) tablet 500 mg     500 mg Oral 2 times daily 07/23/13 1635     07/23/13 0846  ciprofloxacin (CIPRO) IVPB 400 mg     400 mg 200 mL/hr over 60 Minutes Intravenous 60 min pre-op 07/23/13 0846 07/23/13 1302    .  He was given sequential compression devices and early ambulation for DVT prophylaxis.  He benefited maximally from the hospital stay and there were no complications.    Recent vital signs:  Filed Vitals:   07/24/13 0458  BP: 146/83  Pulse: 81  Temp: 99 F (37.2 C)  Resp: 20    Recent laboratory studies:  Lab Results  Component Value Date   HGB 16.5 07/24/2013   HGB 16.4 07/19/2013   Lab Results  Component Value Date   WBC 10.0 07/19/2013   PLT 196 07/19/2013   Lab Results  Component Value Date   INR 1.03 07/22/2013   Lab  Results  Component Value Date   NA 144 07/19/2013   K 3.8 07/19/2013   CL 104 07/19/2013   CO2 24 07/19/2013   BUN 10 07/19/2013   CREATININE 0.90 07/19/2013   GLUCOSE 114* 07/19/2013    Discharge Medications:     Medication List         DIGESTIVE ENZYME PO  Take 1 tablet by mouth daily.     docusate sodium 100 MG capsule  Commonly known as:  COLACE  Take 1 capsule (100 mg total) by mouth 2 (two) times daily.     levothyroxine 100 MCG tablet  Commonly known as:  SYNTHROID, LEVOTHROID  Take 100 mcg by mouth daily before breakfast.     ondansetron 4 MG disintegrating tablet  Commonly known as:  ZOFRAN ODT  Take 1 tablet (4 mg total) by mouth every 8 (eight) hours as needed for nausea or vomiting.     OVER THE COUNTER MEDICATION  Take 1 tablet by mouth daily.     OVER THE COUNTER MEDICATION  Take 1 tablet by mouth daily. MINREX     OVER THE COUNTER MEDICATION  Take 1 tablet by mouth daily. CLEANZYNE     oxyCODONE-acetaminophen 5-325 MG per tablet  Commonly known as:  ROXICET  Take 1 tablet by mouth every 4 (four) hours as needed for severe pain.  PROBIOTIC PO  Take 1 tablet by mouth daily.     VITAMIN D-VITAMIN K PO  Take 1 tablet by mouth daily.        Diagnostic Studies: Ct Abdomen Pelvis Wo Contrast  07/20/2013   CLINICAL DATA:  Left flank pain and left lower quadrant pain with nausea and vomiting.  EXAM: CT ABDOMEN AND PELVIS WITHOUT CONTRAST  TECHNIQUE: Multidetector CT imaging of the abdomen and pelvis was performed following the standard protocol without intravenous contrast.  COMPARISON:  None.  FINDINGS: Lung bases are within normal.  Abdominal images demonstrate minimal fatty infiltration of the liver. The spleen, pancreas, gallbladder and adrenal glands are within normal. The appendix is normal.  Kidneys are normal in size. There is mild left-sided hydronephrosis with a 1.6 cm stone over the left UPJ. Remainder of the ureters are unremarkable. There are no right  renal stones.  Pelvic images are unremarkable. Bones soft tissues are within normal.  IMPRESSION: 1.6 cm stone over the left UPJ causing low-grade obstruction.  Mild fatty infiltration of the liver.   Electronically Signed   By: Elberta Fortisaniel  Boyle M.D.   On: 07/20/2013 00:01   Ir [place] Guide Bx Asp/drain  07/23/2013   CLINICAL DATA:  Calculus centrally in the left renal collecting system near the UPJ. Planned intraoperative nephrolithotomy  EXAM: LEFT PERCUTANEOUS NEPHROURETERAL CATHETER PLACEMENT UNDER ULTRASOUND AND FLUOROSCOPIC GUIDANCE  FLUOROSCOPY TIME:  60 seconds  TECHNIQUE: The procedure, risks (including but not limited to bleeding, infection, organ damage ), benefits, and alternatives were explained to the patient. Questions regarding the procedure were encouraged and answered. The patient understands and consents to the procedure. Leftflank region prepped with Betadine, draped in usual sterile fashion, infiltrated locally with 1% lidocaine.As antibiotic prophylaxis, Cipro 400 mg IV was ordered pre-procedure and administered intravenously within one hour of incision.  Intravenous Fentanyl and Versed were administered as conscious sedation during continuous cardiorespiratory monitoring by the radiology RN, with a total moderate sedation time of 10 minutes.  Under real-time ultrasound guidance, a 21-gauge trocar needle was advanced into a posterior lower pole calyx. Ultrasound image documentation was saved. Small contrast injection under fluoroscopy confirms appropriate positioning. Needle was exchanged over a guidewire for transitional dilator. Dilator was exchanged over a guidewire for a 5 [place] Kumpe catheter, negotiated down the ureter and coiled in the urinary bladder. Catheter was capped and secured externally. No immediate complication.  IMPRESSION: 1. Technically successful left percutaneous nephroureteral catheter placement in preparation for intraoperative nephrolithotomy.   Electronically Signed   By:  Oley Balmaniel  Hassell M.D.   On: 07/23/2013 14:56   Dg C-arm 1-60 Min-no Report  07/23/2013   CLINICAL DATA:  Percutaneous nephrolithotomy.  EXAM: DG C-ARM 1-60 MIN - NRPT [facility]  COMPARISON:  None.  FINDINGS: Six fluoro graphic images demonstrate access of the left intrarenal collecting system with passage of the nephro ureteral wire. There is a filling defect in the distended renal pelvis consistent a large stone.  IMPRESSION: For graphic images from a left percutaneous nephrolithotomy procedure. Please refer to the procedure report for complete description.   Electronically Signed   By: Amie Portlandavid  Ormond M.D.   On: 07/23/2013 14:45   Ir [place] AbtsIntro Uret Cath Perc Left  07/23/2013   CLINICAL DATA:  Calculus centrally in the left renal collecting system near the UPJ. Planned intraoperative nephrolithotomy  EXAM: LEFT PERCUTANEOUS NEPHROURETERAL CATHETER PLACEMENT UNDER ULTRASOUND AND FLUOROSCOPIC GUIDANCE  FLUOROSCOPY TIME:  60 seconds  TECHNIQUE: The procedure, risks (including but not limited  to bleeding, infection, organ damage ), benefits, and alternatives were explained to the patient. Questions regarding the procedure were encouraged and answered. The patient understands and consents to the procedure. Leftflank region prepped with Betadine, draped in usual sterile fashion, infiltrated locally with 1% lidocaine.As antibiotic prophylaxis, Cipro 400 mg IV was ordered pre-procedure and administered intravenously within one hour of incision.  Intravenous Fentanyl and Versed were administered as conscious sedation during continuous cardiorespiratory monitoring by the radiology RN, with a total moderate sedation time of 10 minutes.  Under real-time ultrasound guidance, a 21-gauge trocar needle was advanced into a posterior lower pole calyx. Ultrasound image documentation was saved. Small contrast injection under fluoroscopy confirms appropriate positioning. Needle was exchanged over a guidewire for transitional dilator. Dilator  was exchanged over a guidewire for a 5 [place] Kumpe catheter, negotiated down the ureter and coiled in the urinary bladder. Catheter was capped and secured externally. No immediate complication.  IMPRESSION: 1. Technically successful left percutaneous nephroureteral catheter placement in preparation for intraoperative nephrolithotomy.   Electronically Signed   By: Oley Balm M.D.   On: 07/23/2013 14:56    Disposition: 01-Home or Self Care      Discharge Orders   Future Orders Complete By Expires   Discontinue IV  As directed       Follow-up Information   Follow up with [facility], NP On 07/29/2013. (930)    Specialty:  Urology   Contact information:   [address] [facility]  [place] [address] [phone]        Signed: Bjorn Pippin 07/24/2013, 7:36 AM

## 2013-07-24 NOTE — Care Management Note (Signed)
    Page 1 of 1   07/24/2013     2:12:59 PM   CARE MANAGEMENT NOTE 07/24/2013  Patient:  Shawn Gay,Shawn Gay   Account Number:  [id number]  Date Initiated:  07/24/2013  Documentation initiated by:  Lanier ClamMAHABIR,Garion Wempe  Subjective/Objective Assessment:   38 Y/O Gay ADMITTED W/L RENAL STONE.     Action/Plan:   FROM HOME.   Anticipated DC Date:  07/24/2013   Anticipated DC Plan:  HOME/SELF CARE      DC Planning Services  CM consult      Choice offered to / List presented to:             Status of service:  Completed, signed off Medicare Important Message given?   (If response is "NO", the following Medicare IM given date fields will be blank) Date Medicare IM given:   Date Additional Medicare IM given:    Discharge Disposition:  HOME/SELF CARE  Per UR Regulation:  Reviewed for med. necessity/level of care/duration of stay  If discussed at Long Length of Stay Meetings, dates discussed:    Comments:  07/24/13 Sharronda Schweers RN,BSN NCM [phone] D/C HOME NO NEEDS OR ORDERS.

## 2013-07-24 NOTE — Op Note (Signed)
Shawn Gay                 ACCOUNT NO.:  [id number]  MEDICAL RECORD NO.:  [id number]  LOCATION:  XRAY                         FACILITY:  [facility]  PHYSICIAN:  Excell Seltzer. Annabell Howells, M.D.    DATE OF BIRTH:  1976/04/15  DATE OF PROCEDURE:  07/23/2013 DATE OF DISCHARGE:                              OPERATIVE REPORT   PROCEDURE:  Left percutaneous nephrolithotomy for 1.6-cm stone.  PREOPERATIVE DIAGNOSIS:  Left 1.6-cm stone.  POSTOPERATIVE DIAGNOSIS:  Left 1.6-cm stone.  SURGEON:  Excell Seltzer. Annabell Howells, MD  ANESTHESIA:  General.  SPECIMENS:  Stone.  DRAINS:  A Foley catheter 24-French left nephrostomy catheter and 6- French left safety catheter.  ESTIMATED BLOOD LOSS:  Minimal.  COMPLICATIONS:  None.  INDICATIONS:  Shawn Gay is a 38 year old white male with a history of stones as well as a history of left UPJ obstruction, it was managed endoscopically several years ago.  He presented to the emergency room over the weekend with a 1.6-cm left UPJ stone and after reviewing the options for treatment, he has elected to undergo percutaneous nephrolithotomy which I thought was most appropriate because of his history of the UPJ issues and the size of the stone.  DESCRIPTION OF PROCEDURE:  He was taken to Interventional Radiology earlier today, given Cipro, and a left nephrostomy catheter was placed. Separate report will be dictated.  He was then taken to the operating room where general anesthetic was induced on the holding room stretcher.  A Foley catheter was placed using sterile technique and he was fitted with PAS hose.  He was then rolled prone on chest rolls with appropriate padding of all pressure points, and the left nephrostomy site was prepped with Betadine solution and draped in usual sterile fashion.  I then passed a guidewire under fluoroscopic guidance through the nephrostomy catheter to the bladder and the nephrostomy catheter was removed.  The skin incision was enlarged to 2  cm and a 10-French peel- away sheath was placed over the wire into the proximal ureter.  This was then backed out and a double-lumen catheter was inserted initially until the tip was in the renal pelvis where contrast was instilled and a nephrostogram was performed that confirmed the presence of the stone in the renal pelvis.  The access was through a lower pole calyx, but was felt to be adequate for this particular stone.  At this point, the double-lumen catheter was advanced to the distal ureter and a second wire was placed through the second lumen.  The double-lumen catheter was then removed, leaving two wires in place.  A Trackmaster balloon with sheath was then placed over the working wire. The tip was placed into the renal pelvis and the balloon was dilated to 18 atmospheres.  The access sheath was then advanced over the nephrostomy over the balloon into the renal pelvis.  The balloon was then removed, leaving the wire in place, and a rigid nephroscope was then inserted through the sheath alongside the wire. The stone was easily visualized and had relatively fragile appearance.  The [facility] stone blaster was then used to break the stone into manageable pieces which were then removed with a 2-jawed grasper.  Once all visible fragments were removed, the collecting system was inspected thoroughly and no residual stones were noted.  To be sure, I then inserted a flexible nephroscope and investigated the upper mid pole calices in the UPJ and no additional stones were identified.  In addition, the UPJ appeared to be widely patent.  At this point, a 24-French Ainsworth catheter was passed over the working wire through the access sheath.  The access sheath was backed out and the balloon was filled with 3 mL of sterile fluid.  Contrast was instilled.  This revealed no residual filling defects in the collecting system and good position of the catheter without evidence of  extravasation.  A 6-French Kumpe catheter was placed over the safety wire and positioned just above the bladder.  The wire was then removed and the safety catheter was capped.  Both the safety catheter and the Foley catheter were secured to the skin with a 2-0 silk suture and pressure was applied for 2 minutes to aid hemostasis.  At this point, there was no evidence of bleeding.  The drapes were removed.  The nephrostomy catheter was placed to straight drainage.  A dressing of 4x4s, ABDs, and a Tegaderm were applied.  The patient was then rolled supine on the recovery room stretcher.  His anesthetic was reversed.  He was moved to the recovery room in stable condition.  There were no complications.     Excell SeltzerJohn J. Annabell HowellsWrenn, M.D.     JJW/MEDQ  D:  07/23/2013  T:  07/24/2013  Job:  [id number]

## 2013-07-24 NOTE — ED Notes (Signed)
Left flank pain. He had a kidney stone removed surgically yesterday.

## 2013-07-24 NOTE — Progress Notes (Signed)
Patient laying in bed with eyes open.  Patient complains of bloating and feeling full.  Patient head is normocephalic with no lesions present.  Ears are in line with lateral canvas; bend with no pain; no lesions.  Pupils react and accommodate to light; sclera is normal for race; conjunctiva is pink and moist.  Nose is midline; septum is not deviated; mucous membranes are pink and moist.  Teeth are intact; mouth is moist and pink; no lesions.  Skin is intact except in lower left back because of kidney stone removal.  Pedal and radial pulse 2+. Lung sounds are clear with no adventitious sounds heard. Heart rate and rhythm within normal limits.  Bowel sounds are hypoactive in all four quadrants; soft; nontender.  Patient walked in hallway in order to get GI system moving.  Patient also given Colace to help pass gas and BM. Patient reports feeling a little better after walking in hallway. Patient does not need anything else at the moment. Lorn Junesenise Alexandre Faries, SN  Filed Vitals:   07/24/13 0908  BP: 145/90  Pulse: 84  Temp: 98.3 F (36.8 C)  Resp: 16

## 2013-07-24 NOTE — Op Note (Deleted)
Gay, Shawn                 ACCOUNT NO.:  [id number]  MEDICAL RECORD NO.:  [id number]  LOCATION:  XRAY                         FACILITY:  [facility]  PHYSICIAN:  Excell Seltzer. Annabell Howells, M.D.    DATE OF BIRTH:  1976/04/15  DATE OF PROCEDURE:  07/23/2013 DATE OF DISCHARGE:                              OPERATIVE REPORT   PROCEDURE:  Left percutaneous nephrolithotomy for 1.6-cm stone.  PREOPERATIVE DIAGNOSIS:  Left 1.6-cm stone.  POSTOPERATIVE DIAGNOSIS:  Left 1.6-cm stone.  SURGEON:  Excell Seltzer. Annabell Howells, MD  ANESTHESIA:  General.  SPECIMENS:  Stone.  DRAINS:  A Foley catheter 24-French left nephrostomy catheter and 6- French left safety catheter.  ESTIMATED BLOOD LOSS:  Minimal.  COMPLICATIONS:  None.  INDICATIONS:  Shawn Gay is a 38 year old white male with a history of stones as well as a history of left UPJ obstruction, it was managed endoscopically several years ago.  He presented to the emergency room over the weekend with a 1.6-cm left UPJ stone and after reviewing the options for treatment, he has elected to undergo percutaneous nephrolithotomy which I thought was most appropriate because of his history of the UPJ issues and the size of the stone.  DESCRIPTION OF PROCEDURE:  He was taken to Interventional Radiology earlier today, given Cipro, and a left nephrostomy catheter was placed. Separate report will be dictated.  He was then taken to the operating room where general anesthetic was induced on the holding room stretcher.  A Foley catheter was placed using sterile technique and he was fitted with PAS hose.  He was then rolled prone on chest rolls with appropriate padding of all pressure points, and the left nephrostomy site was prepped with Betadine solution and draped in usual sterile fashion.  I then passed a guidewire under fluoroscopic guidance through the nephrostomy catheter to the bladder and the nephrostomy catheter was removed.  The skin incision was enlarged to 2  cm and a 10-French peel- away sheath was placed over the wire into the proximal ureter.  This was then backed out and a double-lumen catheter was inserted initially until the tip was in the renal pelvis where contrast was instilled and a nephrostogram was performed that confirmed the presence of the stone in the renal pelvis.  The access was through a lower pole calyx, but was felt to be adequate for this particular stone.  At this point, the double-lumen catheter was advanced to the distal ureter and a second wire was placed through the second lumen.  The double-lumen catheter was then removed, leaving two wires in place.  A Trackmaster balloon with sheath was then placed over the working wire. The tip was placed into the renal pelvis and the balloon was dilated to 18 atmospheres.  The access sheath was then advanced over the nephrostomy over the balloon into the renal pelvis.  The balloon was then removed, leaving the wire in place, and a rigid nephroscope was then inserted through the sheath alongside the wire. The stone was easily visualized and had relatively fragile appearance.  The [facility] stone blaster was then used to break the stone into manageable pieces which were then removed with a 2-jawed grasper.  Once all visible fragments were removed, the collecting system was inspected thoroughly and no residual stones were noted.  To be sure, I then inserted a flexible nephroscope and investigated the upper mid pole calices in the UPJ and no additional stones were identified.  In addition, the UPJ appeared to be widely patent.  At this point, a 24-French Ainsworth catheter was passed over the working wire through the access sheath.  The access sheath was backed out and the balloon was filled with 3 mL of sterile fluid.  Contrast was instilled.  This revealed no residual filling defects in the collecting system and good position of the catheter without evidence of  extravasation.  A 6-French Kumpe catheter was placed over the safety wire and positioned just above the bladder.  The wire was then removed and the safety catheter was capped.  Both the safety catheter and the Foley catheter were secured to the skin with a 2-0 silk suture and pressure was applied for 2 minutes to aid hemostasis.  At this point, there was no evidence of bleeding.  The drapes were removed.  The nephrostomy catheter was placed to straight drainage.  A dressing of 4x4s, ABDs, and a Tegaderm were applied.  The patient was then rolled supine on the recovery room stretcher.  His anesthetic was reversed.  He was moved to the recovery room in stable condition.  There were no complications.     Excell SeltzerJohn J. Annabell HowellsWrenn, M.D.     JJW/MEDQ  D:  07/23/2013  T:  07/24/2013  Job:  [id number]

## 2013-07-25 ENCOUNTER — Encounter (HOSPITAL_COMMUNITY): Payer: Self-pay | Admitting: Emergency Medicine

## 2013-07-25 ENCOUNTER — Emergency Department (HOSPITAL_COMMUNITY): Payer: 59

## 2013-07-25 NOTE — ED Notes (Signed)
Dr. Campos at bedside   

## 2013-07-25 NOTE — ED Notes (Signed)
Bed: WA17 Expected date:  Expected time:  Means of arrival:  Comments: 

## 2013-07-25 NOTE — ED Notes (Signed)
Patient is alert and oriented x3.  He was given DC instructions and follow up visit instructions.  Patient gave verbal understanding.  He was DC ambulatory under his own power to home.  V/S stable.  He was not showing any signs of distress on DC 

## 2013-07-25 NOTE — ED Notes (Signed)
Pt had surgical removal of 1.6cm kidney stone yesterday, was discharged this morning. At 730 pm, sudden onset L side flank pain. Pt presented diaphoretic, normotensive, with pure blood in urine. Dressing from surgery is clean and dry. WBC 11.4, temp 99.4. Pt has had 3mg  Dilaudid and 4mg  Zofran, pain still at 8/10. Pt on 2L [place] because O2 sat dropped to 80s after receiving Dilaudid.

## 2013-07-25 NOTE — ED Notes (Signed)
US completed

## 2013-07-25 NOTE — ED Provider Notes (Signed)
Resolution of pain on transfer to this emergency department.  Patient states that his pain resolved and at this time he has absolutely no pain.  Feels much better.  Recent left ureteral stone removal.  Patient does feel like he passed a small blood clot as well.  No significant hydronephrosis on the left.  Pain resolved.  Discharge home in good condition.  I discussed the case with urology prior to return of the ultrasound and he stated that the patient's pain being improved he should be available to followup in the urology clinic unless something significantly is abnormal on ultrasound.  Family updated that there is no significant swelling of the left kidney  [place] Renal 07/25/2013   CLINICAL DATA:  Hematuria. 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  EXAM: RENAL/URINARY TRACT ULTRASOUND COMPLETE  COMPARISON:  CT abdomen and pelvis 07/19/2013.  FINDINGS: Right Kidney:  Length: 13.3 cm. Echogenicity within normal limits. No mass or hydronephrosis visualized.  Left Kidney:  Length: 14.7 cm. Echogenic foci are demonstrated within the left renal collecting system consistent with residual stone fragments. There is mild pyelocaliectasis with hyperechoic appearance of the collecting systems. This could be due to hemorrhage or residual stone fragments.  Bladder:  Appears normal for degree of bladder distention. Bilateral urine flow jets are demonstrated. Prevoid bladder volume is measured at 155 mm. Postvoid images were not obtained.  IMPRESSION: Mild residual dilatation of the left renal collecting system with residual stone fragments and debris present.   Electronically Signed   By: Burman NievesWilliam  Stevens M.D.   On: 07/25/2013 04:36  I personally reviewed the imaging tests through PACS system I reviewed available ER/hospitalization records through the EMR      Lyanne CoKevin M Shep Porter, MD 07/25/13 [phone]

## 2013-07-26 LAB — URINE CULTURE
Colony Count: NO GROWTH
Culture: NO GROWTH

## 2016-09-25 ENCOUNTER — Ambulatory Visit
Admission: RE | Admit: 2016-09-25 | Discharge: 2016-09-25 | Disposition: A | Discharge status: Home / Self Care | Payer: 59 | Source: Ambulatory Visit | Attending: Gastroenterology | Admitting: Gastroenterology

## 2016-09-25 ENCOUNTER — Inpatient Hospital Stay (HOSPITAL_COMMUNITY)
Admission: AD | Admit: 2016-09-25 | Discharge: 2016-09-29 | DRG: 392 | Disposition: A | Discharge status: Home / Self Care | Payer: 59 | Source: Ambulatory Visit | Attending: Gastroenterology | Admitting: Gastroenterology

## 2016-09-25 ENCOUNTER — Encounter (HOSPITAL_COMMUNITY): Payer: Self-pay | Admitting: General Practice

## 2016-09-25 ENCOUNTER — Other Ambulatory Visit: Payer: Self-pay | Admitting: Gastroenterology

## 2016-09-25 DIAGNOSIS — N200 Calculus of kidney: Secondary | ICD-10-CM | POA: Diagnosis present

## 2016-09-25 DIAGNOSIS — K5720 Diverticulitis of large intestine with perforation and abscess without bleeding: Secondary | ICD-10-CM

## 2016-09-25 DIAGNOSIS — K219 Gastro-esophageal reflux disease without esophagitis: Secondary | ICD-10-CM | POA: Diagnosis present

## 2016-09-25 DIAGNOSIS — E039 Hypothyroidism, unspecified: Secondary | ICD-10-CM | POA: Diagnosis present

## 2016-09-25 DIAGNOSIS — Z89022 Acquired absence of left finger(s): Secondary | ICD-10-CM

## 2016-09-25 DIAGNOSIS — R1032 Left lower quadrant pain: Secondary | ICD-10-CM

## 2016-09-25 DIAGNOSIS — Z91018 Allergy to other foods: Secondary | ICD-10-CM

## 2016-09-25 DIAGNOSIS — Z87891 Personal history of nicotine dependence: Secondary | ICD-10-CM

## 2016-09-25 DIAGNOSIS — B954 Other streptococcus as the cause of diseases classified elsewhere: Secondary | ICD-10-CM | POA: Diagnosis present

## 2016-09-25 DIAGNOSIS — K630 Abscess of intestine: Secondary | ICD-10-CM | POA: Diagnosis present

## 2016-09-25 HISTORY — DX: Diverticulitis of large intestine with perforation and abscess without bleeding: K57.20

## 2016-09-25 HISTORY — DX: Gastro-esophageal reflux disease without esophagitis: K21.9

## 2016-09-25 HISTORY — DX: Personal history of urinary calculi: Z87.442

## 2016-09-25 LAB — BASIC METABOLIC PANEL
Anion gap: 8 (ref 5–15)
BUN: 11 mg/dL (ref 6–20)
CO2: 27 mmol/L (ref 22–32)
Calcium: 9.4 mg/dL (ref 8.9–10.3)
Chloride: 102 mmol/L (ref 101–111)
Creatinine, Ser: 0.96 mg/dL (ref 0.61–1.24)
GFR calc Af Amer: >60 mL/min (ref >60)
GLUCOSE: 111 mg/dL — AB (ref 65–99)
POTASSIUM: 4.1 mmol/L (ref 3.5–5.1)
SODIUM: 137 mmol/L (ref 135–145)

## 2016-09-25 LAB — CBC
HCT: 40.7 % (ref 39.0–52.0)
Hemoglobin: 14.2 g/dL (ref 13.0–17.0)
MCH: 30.7 pg (ref 26.0–34.0)
MCHC: 34.9 g/dL (ref 30.0–36.0)
MCV: 88.1 fL (ref 78.0–100.0)
PLATELETS: 184 10*3/uL (ref 150–400)
RBC: 4.62 MIL/uL (ref 4.22–5.81)
RDW: 12.2 % (ref 11.5–15.5)
WBC: 9.5 10*3/uL (ref 4.0–10.5)

## 2016-09-25 MED ORDER — ENOXAPARIN SODIUM 40 MG/0.4ML ~~LOC~~ SOLN
40.0000 mg | SUBCUTANEOUS | Status: DC
  Filled 2016-09-25: qty 0.4

## 2016-09-25 MED ORDER — MORPHINE SULFATE (PF) 4 MG/ML IV SOLN
2.0000 mg | INTRAVENOUS | Status: DC | PRN
  Administered 2016-09-26: 2 mg via INTRAVENOUS
  Administered 2016-09-26 (×2): 1 mg via INTRAVENOUS
  Filled 2016-09-25 (×4): qty 1

## 2016-09-25 MED ORDER — ACETAMINOPHEN 650 MG RE SUPP: 650.0000 mg | Freq: Four times a day (QID) | RECTAL | Status: DC | PRN

## 2016-09-25 MED ORDER — IOPAMIDOL (ISOVUE-300) INJECTION 61%
100.0000 mL | Freq: Once | INTRAVENOUS | Status: AC | PRN
  Administered 2016-09-25: 100 mL via INTRAVENOUS

## 2016-09-25 MED ORDER — METRONIDAZOLE IN NACL 5-0.79 MG/ML-% IV SOLN
500.0000 mg | Freq: Three times a day (TID) | INTRAVENOUS | Status: DC
  Administered 2016-09-25 – 2016-09-29 (×11): 500 mg via INTRAVENOUS
  Filled 2016-09-25 (×13): qty 100

## 2016-09-25 MED ORDER — CIPROFLOXACIN IN D5W 400 MG/200ML IV SOLN
400.0000 mg | Freq: Two times a day (BID) | INTRAVENOUS | Status: DC
  Administered 2016-09-25 – 2016-09-29 (×8): 400 mg via INTRAVENOUS
  Filled 2016-09-25 (×8): qty 200

## 2016-09-25 MED ORDER — ACETAMINOPHEN 325 MG PO TABS
650.0000 mg | ORAL_TABLET | Freq: Four times a day (QID) | ORAL | Status: DC | PRN
  Administered 2016-09-25 – 2016-09-28 (×6): 650 mg via ORAL
  Filled 2016-09-25 (×7): qty 2

## 2016-09-25 MED ORDER — SODIUM CHLORIDE 0.9 % IV SOLN
INTRAVENOUS | Status: DC
  Administered 2016-09-25 – 2016-09-26 (×3): via INTRAVENOUS
  Administered 2016-09-27: 100 mL/h via INTRAVENOUS
  Administered 2016-09-28 – 2016-09-29 (×2): via INTRAVENOUS

## 2016-09-25 NOTE — Progress Notes (Signed)
Patient arrived to the department via wheelchair. Patient is Alert and Oriented X4. VSS. RN paged attending to notify of patient's arrival and to get orders placed. Will continue to monitor patient and await new orders.

## 2016-09-25 NOTE — H&P (Signed)
Shawn Gay HPI: This is a 41 year old male with a PMH of renal stones admitted for a sigmoid diverticular abscess.  He states that over a week ago he started to have acute LLQ pain and there was some radiation to his back.  He was uncertain if this was from his renal stone, but he was at his sisters wedding and a relative, who was a Careers advisersurgeon, told him that it could also be diverticulitis.  Over the interval time period he suffered with the symptoms and he presented to the office yesterday.  The examination was significant for LLQ tenderness and no evidence of any CVA tenderness.  He was afebrile and his blood work showed a normal WBC at 10.  The patient did not exhibit any signs of an acute abdomen.  He was emperically started on Cipro and Flagyl, but he contacted me this AM reporting that he had a very difficult night with pain and drenching sweats.  A CT scan of the ABM/pelvis was obtained and it revealed a 3.0 x 4.5 x 3.5 cm sigmoid diverticular abscess with contained extraluminal air.  He has a 1.6 cm renal stone, which appears to be unchanged from his prior 2015 CT scan.  With the findings of the CT scan he was admitted to the hospital.  Past Medical History:  Diagnosis Date  . Gluten intolerance   . History of kidney stones   . Hypothyroid   . UPJ obstruction, congenital     Past Surgical History:  Procedure Laterality Date  . ENDOPYELOTOMY     [name]/notes 07/19/2013  . 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 LITHOTRIPSY  2003 or 2004 X 2  . NEPHROLITHOTOMY Left 07/23/2013   Procedure: LEFT NEPHROLITHOTOMY PERCUTANEOUS;  Surgeon: Bjorn PippinJohn Wrenn, MD;  Location: [facility];  Service: Urology;  Laterality: Left;  . URETEROSCOPY  2003 or 2003   x 1    Family History  Problem Relation Age of Onset  . Diabetes Paternal Grandfather   . Cancer Paternal Grandfather     Social History:  reports that he has quit smoking. His smoking use included Cigarettes. He does not have any smokeless tobacco  history on file. He reports that he does not drink alcohol or use drugs.  Allergies:  Allergies  Allergen Reactions  . Gluten Meal Other (See Comments)    Gas /pain    Medications:  Scheduled: . enoxaparin (LOVENOX) injection  40 mg Subcutaneous Q24H   Continuous: . sodium chloride    . ciprofloxacin    . metronidazole      No results found for this or any previous visit (from the past 24 hour(s)).   Ct Abdomen Pelvis W Contrast  Result Date: 09/25/2016 CLINICAL DATA:  Left lower quadrant abdominal pain for 12 days. Night sweats. EXAM: CT ABDOMEN AND PELVIS WITH CONTRAST TECHNIQUE: Multidetector CT imaging of the abdomen and pelvis was performed using the standard protocol following bolus administration of intravenous contrast. CONTRAST:  100mL ISOVUE-300 IOPAMIDOL (ISOVUE-300) INJECTION 61% COMPARISON:  Multiple exams, including 07/19/2013 CT scan FINDINGS: Lower chest: Unremarkable Hepatobiliary: Mild diffuse hepatic steatosis is suspected. Otherwise unremarkable. Pancreas: Unremarkable Spleen: Unremarkable Adrenals/Urinary Tract: Adrenal glands normal. 9 mm fluid density cyst of the right kidney upper pole. Left lower renal pelvis 1.6 by 0.8 by 0.9 cm calculus along with several smaller caliceal calculi in the left kidney lower pole. Subtle wall thickening along the left renal pelvis inferiorly is probably secondary to inflammation from the  adjacent stone. Urinary bladder unremarkable. Stomach/Bowel: There is an abscess spanning from the wall of the distal sigmoid colon into the adjacent paracolic adipose tissues, with several small internal loculations of localized gas, total size of this process approximately 3.0 by 4.3 by 3.5 cm. This is likely a diverticular abscess, less likely to be an ulceration of the wall of the colon with paracolic abscess. The more cephalad portions did not have a sharply defined enhancing wall both the portion of the abscess in the wall of the colon is more  clearly defined. There is surrounding inflammatory stranding. Several other sigmoid colon diverticula are present. Vascular/Lymphatic: Unremarkable Reproductive: Unremarkable Other: No supplemental non-categorized findings. Musculoskeletal: 6 mm grade 1 anterolisthesis of L5 on S1 associated with chronic L5 pars defects. Moderate left and mild right foraminal stenosis at this level results. Small umbilical hernia contains adipose tissue. IMPRESSION: 1. An abscess spans from the wall of the distal sigmoid colon into the adjacent paracolic fat, with several locules of locally contained extraluminal gas. This is likely a diverticular abscess in the setting of diverticulitis. Less likely to be an ulceration or ulcerative mass of the colon wall leading to colon wall and paracolic abscess. Total size of the abnormal collection of gas and fluid approximately 3.0 by 4.3 by 3.5 cm although a significant portion of this is within the wall of the sigmoid colon. 2. Mild diffuse hepatic steatosis. 3. Nonobstructive left kidney lower pole calculi including a larger 1.6 cm calculus along the lower renal pelvis. 4. 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 Electronically Signed   By: Gaylyn Rong M.D.   On: 09/25/2016 15:07    ROS:  As stated above in the HPI otherwise negative.  Blood pressure (!) 142/80, pulse 93, temperature (!) 100.6 F (38.1 C), temperature source Oral, resp. rate 20, height 5\' 8"  (1.727 m), weight 97.5 kg (215 lb), SpO2 95 %.    PE: Gen: NAD, Alert and Oriented HEENT:  [place]/AT, EOMI Neck: Supple, no LAD Lungs: CTA Bilaterally CV: RRR without M/G/R ABM: Soft, LLQ tenderness, +BS Ext: No C/C/E  Assessment/Plan: 1) Sigmoid diverticular abscess. 2) LLQ pain. 3) Renal stones.   Surprisingly he is non-toxic appearing and there is no evidence of an acute abdomen, however, with the abscess, I  requested a surgical consultation this evening.  I will contact IR in the AM, but I am not certain if this abscess is amenable to drainage.  The reading suggests that the abscess may be more in the wall of the sigmoid colon.  I will defer to IR for further management.  He will be maintained on Cipro and Flagyl unless Surgery feels that he needs Zosyn.  Morphine was ordered for him as well as Tylenol.  Madsen Riddle D 09/25/2016, 6:29 PM

## 2016-09-25 NOTE — Consult Note (Signed)
Reason for Consult:diverticulitis with abscess Referring Physician: Dr. Jeani HawkingPatrick Hung  Jacobo ForestJoshua M Gay is an 41 y.o. male.  HPI: This is a pleasant 41 year old gentleman admitted by Dr. Elnoria HowardHung earlier today. The patient has a slightly greater than one week history of left lower quadrant abdominal pain hurting due to his back. He has also had night sweats. He saw Dr. Elnoria HowardHung who ordered a CT scan which showed sigmoid diverticulitis with a greater than 3 cm abscess which could be involving the wall of the colon. The patient reports that the pain is spasmodic and intermittent. He had a mucous bowel movement this morning. He has no previous history of diverticulitis.  A white blood count performed at Dr. Haywood PaoHung's office was 10.  Labs here are currently pending.  Past Medical History:  Diagnosis Date  . Colonic diverticular abscess 09/25/2016  . GERD (gastroesophageal reflux disease)   . Gluten intolerance   . History of kidney stones   . Hypothyroid   . UPJ obstruction, congenital     Past Surgical History:  Procedure Laterality Date  . CYSTOSCOPY WITH URETEROSCOPY, STONE BASKETRY AND STENT PLACEMENT     "I've had at least 3 stents"  . ENDOPYELOTOMY     [name]/notes 07/19/2013  . 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 LITHOTRIPSY  2003 or 2004 X 2  . NEPHROLITHOTOMY Left 07/23/2013   Procedure: LEFT NEPHROLITHOTOMY PERCUTANEOUS;  Surgeon: Bjorn PippinJohn Wrenn, MD;  Location: [facility];  Service: Urology;  Laterality: Left;  . URETEROSCOPY  2003 or 2003   x 1    Family History  Problem Relation Age of Onset  . Diabetes Paternal Grandfather   . Cancer Paternal Grandfather     Social History:  reports that he has quit smoking. His smoking use included Cigarettes. He has never used smokeless tobacco. He reports that he drinks about 1.8 oz of alcohol per week . He reports that he does not use drugs.  Allergies:  Allergies  Allergen Reactions  . Gluten Meal Other (See Comments)    Gas /pain     Medications: I have reviewed the patient's current medications.  No results found for this or any previous visit (from the past 48 hour(s)).  Ct Abdomen Pelvis W Contrast  Result Date: 09/25/2016 CLINICAL DATA:  Left lower quadrant abdominal pain for 12 days. Night sweats. EXAM: CT ABDOMEN AND PELVIS WITH CONTRAST TECHNIQUE: Multidetector CT imaging of the abdomen and pelvis was performed using the standard protocol following bolus administration of intravenous contrast. CONTRAST:  100mL ISOVUE-300 IOPAMIDOL (ISOVUE-300) INJECTION 61% COMPARISON:  Multiple exams, including 07/19/2013 CT scan FINDINGS: Lower chest: Unremarkable Hepatobiliary: Mild diffuse hepatic steatosis is suspected. Otherwise unremarkable. Pancreas: Unremarkable Spleen: Unremarkable Adrenals/Urinary Tract: Adrenal glands normal. 9 mm fluid density cyst of the right kidney upper pole. Left lower renal pelvis 1.6 by 0.8 by 0.9 cm calculus along with several smaller caliceal calculi in the left kidney lower pole. Subtle wall thickening along the left renal pelvis inferiorly is probably secondary to inflammation from the adjacent stone. Urinary bladder unremarkable. Stomach/Bowel: There is an abscess spanning from the wall of the distal sigmoid colon into the adjacent paracolic adipose tissues, with several small internal loculations of localized gas, total size of this process approximately 3.0 by 4.3 by 3.5 cm. This is likely a diverticular abscess, less likely to be an ulceration of the wall of the colon with paracolic abscess. The more cephalad portions did not have a sharply defined enhancing wall both  the portion of the abscess in the wall of the colon is more clearly defined. There is surrounding inflammatory stranding. Several other sigmoid colon diverticula are present. Vascular/Lymphatic: Unremarkable Reproductive: Unremarkable Other: No supplemental non-categorized findings. Musculoskeletal: 6 mm grade 1 anterolisthesis of L5  on S1 associated with chronic L5 pars defects. Moderate left and mild right foraminal stenosis at this level results. Small umbilical hernia contains adipose tissue. IMPRESSION: 1. An abscess spans from the wall of the distal sigmoid colon into the adjacent paracolic fat, with several locules of locally contained extraluminal gas. This is likely a diverticular abscess in the setting of diverticulitis. Less likely to be an ulceration or ulcerative mass of the colon wall leading to colon wall and paracolic abscess. Total size of the abnormal collection of gas and fluid approximately 3.0 by 4.3 by 3.5 cm although a significant portion of this is within the wall of the sigmoid colon. 2. Mild diffuse hepatic steatosis. 3. Nonobstructive left kidney lower pole calculi including a larger 1.6 cm calculus along the lower renal pelvis. 4. 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 Electronically Signed   By: Gaylyn Rong M.D.   On: 09/25/2016 15:07    Review of Systems  Constitutional: Positive for diaphoresis and fever.  Respiratory: Negative for cough and shortness of breath.   Cardiovascular: Negative for chest pain.  Gastrointestinal: Positive for abdominal pain. Negative for nausea and vomiting.  Genitourinary: Negative for dysuria.  Musculoskeletal: Negative for myalgias.  Neurological: Negative for weakness and headaches.  All other systems reviewed and are negative.  Blood pressure (!) 142/80, pulse 93, temperature (!) 100.6 F (38.1 C), temperature source Oral, resp. rate 20, height 5\' 8"  (1.727 m), weight 97.5 kg (215 lb), SpO2 95 %. Physical Exam  Constitutional: He appears well-developed and well-nourished. No distress.  HENT:  Head: Normocephalic and atraumatic.  Right Ear: External ear normal.  Left Ear: External ear normal.  Nose: Nose normal.  Mouth/Throat: Oropharynx is clear and  moist. No oropharyngeal exudate.  Eyes: Pupils are equal, round, and reactive to light. Right eye exhibits no discharge. Left eye exhibits no discharge. No scleral icterus.  Neck: Normal range of motion. No tracheal deviation present.  Cardiovascular: Normal rate, regular rhythm, normal heart sounds and intact distal pulses.   Respiratory: Effort normal and breath sounds normal. No respiratory distress. He has no wheezes.  GI: Soft. He exhibits no distension. There is tenderness. There is guarding.  There is mild to moderate left lower quadrant abdominal tenderness with guarding.  The rest of his abdomen is soft and nontender  Lymphadenopathy:    He has no cervical adenopathy.  Skin: He is not diaphoretic.    Assessment/Plan: Sigmoid diverticulitis with abscess  I agree with conservative management as he is not toxic. IV antibodies will be started. Interventional radiology will be asked to consult on the patient tomorrow to consider a possible percutaneous drain versus aspiration. Hopefully this will improve without need for surgical intervention. I discussed this with him in detail. He agrees with the plan.  Carmino Ocain A 09/25/2016, 7:23 PM

## 2016-09-26 ENCOUNTER — Inpatient Hospital Stay (HOSPITAL_COMMUNITY): Payer: 59

## 2016-09-26 LAB — CBC
HEMATOCRIT: 40 % (ref 39.0–52.0)
HEMOGLOBIN: 13.7 g/dL (ref 13.0–17.0)
MCH: 30.3 pg (ref 26.0–34.0)
MCHC: 34.3 g/dL (ref 30.0–36.0)
MCV: 88.5 fL (ref 78.0–100.0)
Platelets: 175 10*3/uL (ref 150–400)
RBC: 4.52 MIL/uL (ref 4.22–5.81)
RDW: 12.3 % (ref 11.5–15.5)
WBC: 9.5 10*3/uL (ref 4.0–10.5)

## 2016-09-26 LAB — BASIC METABOLIC PANEL
Anion gap: 9 (ref 5–15)
BUN: 12 mg/dL (ref 6–20)
CALCIUM: 8.4 mg/dL — AB (ref 8.9–10.3)
CO2: 23 mmol/L (ref 22–32)
Chloride: 105 mmol/L (ref 101–111)
Creatinine, Ser: 0.88 mg/dL (ref 0.61–1.24)
GFR calc Af Amer: >60 mL/min (ref >60)
GLUCOSE: 106 mg/dL — AB (ref 65–99)
Potassium: 3.9 mmol/L (ref 3.5–5.1)
Sodium: 137 mmol/L (ref 135–145)

## 2016-09-26 LAB — PROTIME-INR
INR: 1.18
Prothrombin Time: 15.1 seconds (ref 11.4–15.2)

## 2016-09-26 MED ORDER — MIDAZOLAM HCL 2 MG/2ML IJ SOLN
INTRAMUSCULAR | Status: AC
  Filled 2016-09-26: qty 4

## 2016-09-26 MED ORDER — NALOXONE HCL 0.4 MG/ML IJ SOLN
INTRAMUSCULAR | Status: AC
  Filled 2016-09-26: qty 1

## 2016-09-26 MED ORDER — MORPHINE SULFATE (PF) 4 MG/ML IV SOLN
4.0000 mg | INTRAVENOUS | Status: DC | PRN
  Administered 2016-09-27: 4 mg via INTRAVENOUS
  Filled 2016-09-26 (×2): qty 1

## 2016-09-26 MED ORDER — FLUMAZENIL 0.5 MG/5ML IV SOLN
INTRAVENOUS | Status: AC
  Filled 2016-09-26: qty 5

## 2016-09-26 MED ORDER — MIDAZOLAM HCL 2 MG/2ML IJ SOLN
INTRAMUSCULAR | Status: AC | PRN
  Administered 2016-09-26 (×3): 1 mg via INTRAVENOUS

## 2016-09-26 MED ORDER — FENTANYL CITRATE (PF) 100 MCG/2ML IJ SOLN
INTRAMUSCULAR | Status: AC
  Filled 2016-09-26: qty 4

## 2016-09-26 MED ORDER — MORPHINE SULFATE (PF) 4 MG/ML IV SOLN
2.0000 mg | Freq: Once | INTRAVENOUS | Status: AC
  Administered 2016-09-26: 2 mg via INTRAVENOUS
  Filled 2016-09-26: qty 1

## 2016-09-26 MED ORDER — ENOXAPARIN SODIUM 40 MG/0.4ML ~~LOC~~ SOLN
40.0000 mg | SUBCUTANEOUS | Status: DC
  Administered 2016-09-27 – 2016-09-28 (×2): 40 mg via SUBCUTANEOUS
  Filled 2016-09-26 (×2): qty 0.4

## 2016-09-26 MED ORDER — FENTANYL CITRATE (PF) 100 MCG/2ML IJ SOLN
INTRAMUSCULAR | Status: AC | PRN
  Administered 2016-09-26 (×3): 50 ug via INTRAVENOUS

## 2016-09-26 MED ORDER — LIDOCAINE HCL 1 % IJ SOLN
INTRAMUSCULAR | Status: AC
  Filled 2016-09-26: qty 20

## 2016-09-26 NOTE — Progress Notes (Signed)
Notified Dr. Loreta AveMann of pt's temp of 102.4 which decreased to 101.0 after 650mg  Tylenol.  Will give Tylenol around the clock as needed.

## 2016-09-26 NOTE — Progress Notes (Signed)
Pt spiked temp of 102.4. Heart rate 95. Abdominal pain 5-6 when moving. Refuses pain med. Pt medicated with Tylenol 650mg . MD on call (Dr. Loreta AveMann) paged.

## 2016-09-26 NOTE — Progress Notes (Signed)
Subjective: No new complaints.  Slept better last evening, but still with sweating.  Objective: Vital signs in last 24 hours: Temp:  [98.6 F (37 C)-100.6 F (38.1 C)] 98.6 F (37 C) (06/13 0626) Pulse Rate:  [65-93] 65 (06/13 0626) Resp:  [18-20] 18 (06/13 0626) BP: (124-142)/(68-83) 124/68 (06/13 0626) SpO2:  [95 %-100 %] 98 % (06/13 0626) Weight:  [97.5 kg (215 lb)] 97.5 kg (215 lb) (06/12 1720) Last BM Date: 09/22/16  Intake/Output from previous day: 06/12 0701 - 06/13 0700 In: 1021.7 [I.V.:1021.7] Out: 950 [Urine:950] Intake/Output this shift: No intake/output data recorded.  General appearance: alert and no distress Resp: clear to auscultation bilaterally Cardio: regular rate and rhythm GI: tender in the LLQ Extremities: extremities normal, atraumatic, no cyanosis or edema  Lab Results:  Recent Labs  09/25/16 1842 09/26/16 0554  WBC 9.5 9.5  HGB 14.2 13.7  HCT 40.7 40.0  PLT 184 175   BMET  Recent Labs  09/25/16 1842 09/26/16 0554  NA 137 137  K 4.1 3.9  CL 102 105  CO2 27 23  GLUCOSE 111* 106*  BUN 11 12  CREATININE 0.96 0.88  CALCIUM 9.4 8.4*   LFT No results for input(s): PROT, ALBUMIN, AST, ALT, ALKPHOS, BILITOT, BILIDIR, IBILI in the last 72 hours. PT/INR No results for input(s): LABPROT, INR in the last 72 hours. Hepatitis Panel No results for input(s): HEPBSAG, HCVAB, HEPAIGM, HEPBIGM in the last 72 hours. C-Diff No results for input(s): CDIFFTOX in the last 72 hours. Fecal Lactopherrin No results for input(s): FECLLACTOFRN in the last 72 hours.  Studies/Results: Ct Abdomen Pelvis W Contrast  Result Date: 09/25/2016 CLINICAL DATA:  Left lower quadrant abdominal pain for 12 days. Night sweats. EXAM: CT ABDOMEN AND PELVIS WITH CONTRAST TECHNIQUE: Multidetector CT imaging of the abdomen and pelvis was performed using the standard protocol following bolus administration of intravenous contrast. CONTRAST:  100mL ISOVUE-300 IOPAMIDOL  (ISOVUE-300) INJECTION 61% COMPARISON:  Multiple exams, including 07/19/2013 CT scan FINDINGS: Lower chest: Unremarkable Hepatobiliary: Mild diffuse hepatic steatosis is suspected. Otherwise unremarkable. Pancreas: Unremarkable Spleen: Unremarkable Adrenals/Urinary Tract: Adrenal glands normal. 9 mm fluid density cyst of the right kidney upper pole. Left lower renal pelvis 1.6 by 0.8 by 0.9 cm calculus along with several smaller caliceal calculi in the left kidney lower pole. Subtle wall thickening along the left renal pelvis inferiorly is probably secondary to inflammation from the adjacent stone. Urinary bladder unremarkable. Stomach/Bowel: There is an abscess spanning from the wall of the distal sigmoid colon into the adjacent paracolic adipose tissues, with several small internal loculations of localized gas, total size of this process approximately 3.0 by 4.3 by 3.5 cm. This is likely a diverticular abscess, less likely to be an ulceration of the wall of the colon with paracolic abscess. The more cephalad portions did not have a sharply defined enhancing wall both the portion of the abscess in the wall of the colon is more clearly defined. There is surrounding inflammatory stranding. Several other sigmoid colon diverticula are present. Vascular/Lymphatic: Unremarkable Reproductive: Unremarkable Other: No supplemental non-categorized findings. Musculoskeletal: 6 mm grade 1 anterolisthesis of L5 on S1 associated with chronic L5 pars defects. Moderate left and mild right foraminal stenosis at this level results. Small umbilical hernia contains adipose tissue. IMPRESSION: 1. An abscess spans from the wall of the distal sigmoid colon into the adjacent paracolic fat, with several locules of locally contained extraluminal gas. This is likely a diverticular abscess in the setting of diverticulitis. Less likely  to be an ulceration or ulcerative mass of the colon wall leading to colon wall and paracolic abscess. Total  size of the abnormal collection of gas and fluid approximately 3.0 by 4.3 by 3.5 cm although a significant portion of this is within the wall of the sigmoid colon. 2. Mild diffuse hepatic steatosis. 3. Nonobstructive left kidney lower pole calculi including a larger 1.6 cm calculus along the lower renal pelvis. 4. 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 Electronically Signed   By: Gaylyn Rong M.D.   On: 09/25/2016 15:07    Medications:  Scheduled: . [START ON 09/27/2016] enoxaparin (LOVENOX) injection  40 mg Subcutaneous Q24H   Continuous: . sodium chloride 100 mL/hr at 09/25/16 1947  . ciprofloxacin 400 mg (09/26/16 0625)  . metronidazole 500 mg (09/26/16 0235)    Assessment/Plan: 1) Sigmoid diverticular abscess. 2) Renal stones.   He is clinically stable.  I am appreciative of Dr. Eliberto Ivory evaluation.  He is afebrile this AM compared to the admission temp at 100.  Plan: 1) IR Consult for percutaneous drainage. 2) Maintain NPO. 3) Continue with IV Cipro and metronidazole  LOS: 1 day   Christal Lagerstrom D 09/26/2016, 7:28 AM

## 2016-09-26 NOTE — Procedures (Signed)
Pelvic abscess drain 10 Fr Pus EBL 0 Comp 0

## 2016-09-26 NOTE — Progress Notes (Addendum)
1300 Pt to IR for drain placement. NPO maint.  1500 Pt is back, JP drain to RLQ with purulent drainage, site dressing dry and intact. Pt c/o lower abd pain, no relief from Morphine 2mg , Dr Elnoria HowardHung notified, meds adjusted.

## 2016-09-26 NOTE — Progress Notes (Signed)
[facility] Progress Note     Subjective: CC: abdominal pain. Pt reports this is his first episode of diverticulitis. C/o LLQ and suprapubic abdominal pain that is described as intermittent and sharp. Endorses some nausea associated with the severe pain. Denies vomiting. Last BM was yesterday and was loose. Denies blood in stool. Does endorse some mucous in BM.  Objective: Vital signs in last 24 hours: Temp:  [98.6 F (37 C)-100.6 F (38.1 C)] 98.6 F (37 C) (06/13 0626) Pulse Rate:  [60-93] 60 (06/13 0741) Resp:  [18-20] 18 (06/13 0626) BP: (115-142)/(68-83) 115/82 (06/13 0741) SpO2:  [95 %-100 %] 96 % (06/13 0741) Weight:  [97.5 kg (215 lb)] 97.5 kg (215 lb) (06/12 1720) Last BM Date: 09/22/16  Intake/Output from previous day: 06/12 0701 - 06/13 0700 In: 1021.7 [I.V.:1021.7] Out: 950 [Urine:950] Intake/Output this shift: No intake/output data recorded.  PE: Gen:  Alert, NAD, pleasant HEENT: pupils equal and round, non-icteric sclerae  Card:  Regular rate and rhythm, pedal pulses 2+ BL Pulm:  Normal effort, clear to auscultation bilaterally Abd: Soft, TTP lower abdomen, LLQ>RLQ with voluntary guarding. No peritonitis.  Skin: warm and dry, no rashes  Psych: A&Ox3   Lab Results:   Recent Labs  09/25/16 1842 09/26/16 0554  WBC 9.5 9.5  HGB 14.2 13.7  HCT 40.7 40.0  PLT 184 175   BMET  Recent Labs  09/25/16 1842 09/26/16 0554  NA 137 137  K 4.1 3.9  CL 102 105  CO2 27 23  GLUCOSE 111* 106*  BUN 11 12  CREATININE 0.96 0.88  CALCIUM 9.4 8.4*   PT/INR  Recent Labs  09/26/16 0729  LABPROT 15.1  INR 1.18   CMP     Component Value Date/Time   NA 137 09/26/2016 0554   K 3.9 09/26/2016 0554   CL 105 09/26/2016 0554   CO2 23 09/26/2016 0554   GLUCOSE 106 (H) 09/26/2016 0554   BUN 12 09/26/2016 0554   CREATININE 0.88 09/26/2016 0554   CALCIUM 8.4 (L) 09/26/2016 0554   PROT 6.9 07/24/2013 2245   ALBUMIN 4.4 07/24/2013 2245   AST 35  07/24/2013 2245   ALT 47 07/24/2013 2245   ALKPHOS 49 07/24/2013 2245   BILITOT 0.7 07/24/2013 2245   GFRNONAA >60 09/26/2016 0554   GFRAA >60 09/26/2016 0554   Lipase  No results found for: LIPASE     Studies/Results: Ct Abdomen Pelvis W Contrast  Result Date: 09/25/2016 CLINICAL DATA:  Left lower quadrant abdominal pain for 12 days. Night sweats. EXAM: CT ABDOMEN AND PELVIS WITH CONTRAST TECHNIQUE: Multidetector CT imaging of the abdomen and pelvis was performed using the standard protocol following bolus administration of intravenous contrast. CONTRAST:  ISOVUE-300 IOPAMIDOL (ISOVUE-300) INJECTION 61% COMPARISON:  Multiple exams, including 07/19/2013 CT scan FINDINGS: Lower chest: Unremarkable Hepatobiliary: Mild diffuse hepatic steatosis is suspected. Otherwise unremarkable. Pancreas: Unremarkable Spleen: Unremarkable Adrenals/Urinary Tract: Adrenal glands normal. 9 mm fluid density cyst of the right kidney upper pole. Left lower renal pelvis 1.6 by 0.8 by 0.9 cm calculus along with several smaller caliceal calculi in the left kidney lower pole. Subtle wall thickening along the left renal pelvis inferiorly is probably secondary to inflammation from the adjacent stone. Urinary bladder unremarkable. Stomach/Bowel: There is an abscess spanning from the wall of the distal sigmoid colon into the adjacent paracolic adipose tissues, with several small internal loculations of localized gas, total size of this process approximately 3.0 by 4.3 by 3.5 cm. This is  likely a diverticular abscess, less likely to be an ulceration of the wall of the colon with paracolic abscess. The more cephalad portions did not have a sharply defined enhancing wall both the portion of the abscess in the wall of the colon is more clearly defined. There is surrounding inflammatory stranding. Several other sigmoid colon diverticula are present. Vascular/Lymphatic: Unremarkable Reproductive: Unremarkable Other: No  supplemental non-categorized findings. Musculoskeletal: 6 mm grade 1 anterolisthesis of L5 on S1 associated with chronic L5 pars defects. Moderate left and mild right foraminal stenosis at this level results. Small umbilical hernia contains adipose tissue. IMPRESSION: 1. An abscess spans from the wall of the distal sigmoid colon into the adjacent paracolic fat, with several locules of locally contained extraluminal gas. This is likely a diverticular abscess in the setting of diverticulitis. Less likely to be an ulceration or ulcerative mass of the colon wall leading to colon wall and paracolic abscess. Total size of the abnormal collection of gas and fluid approximately 3.0 by 4.3 by 3.5 cm although a significant portion of this is within the wall of the sigmoid colon. 2. Mild diffuse hepatic steatosis. 3. Nonobstructive left kidney lower pole calculi including a larger 1.6 cm calculus along the lower renal pelvis. 4. 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 Electronically Signed   By: Gaylyn RongWalter  Liebkemann M.D.   On: 09/25/2016 15:07    Anti-infectives: Anti-infectives    Start     Dose/Rate Route Frequency Ordered Stop   09/25/16 1900  ciprofloxacin (CIPRO) IVPB 400 mg     400 mg 200 mL/hr over 60 Minutes Intravenous Every 12 hours 09/25/16 1808     09/25/16 1900  metroNIDAZOLE (FLAGYL) IVPB 500 mg     500 mg 100 mL/hr over 60 Minutes Intravenous Every 8 hours 09/25/16 1808       Assessment/Plan Diverticulitis with abscess - NPO, bowel rest, IV abx - IR consult for percutaneous drainage vs aspiration  - pain control  FEN: NPO, IVF ID: cipro/flagyl; WBC 9.5 VTE: SCD's, Lovenox    LOS: 1 day    Adam PhenixElizabeth S Eliazar Olivar , [facility] 09/26/2016, 8:37 AM Pager: [phone] Consults: [phone] Mon-Fri 7:00 am-4:30 pm Sat-Sun 7:00 am-11:30 am

## 2016-09-26 NOTE — H&P (Signed)
Chief Complaint: Sigmoid diverticular abscess  Referring Physician(s): Abigail Miyamotoouglas Blackman  Supervising Physician: Jolaine ClickHoss, Arthur  [facility]  History of Present Illness: Shawn Gay is a 41 y.o. male with a history of kidney stones who was admitted for a sigmoid diverticular abscess.    He states that over a week ago he started to have acute LLQ pain and there was some radiation to his back.    He was uncertain if this was from his renal stone, but he was at his sisters wedding and a relative, who was a Careers advisersurgeon, told him that it could also be diverticulitis.    Over the interval time period he suffered with the symptoms and he presented to Dr. Rolla EtienneHungs office on Monday.    Exam at that time was significant for LLQ tenderness and no evidence of any CVA tenderness.    He was afebrile and his blood work showed a normal WBC at 10.    He was emperically started on Cipro and Flagyl but continued to worsen. He had night sweats and increased pain.  A CT scan of the abdomen/pelvis was obtained and it revealed a 3.0 x 4.5 x 3.5 cm sigmoid diverticular abscess with contained extraluminal air.    He has a 1.6 cm renal stone, which appears to be unchanged from his prior 2015 CT scan.    We are asked to evaluate him for image guided drainage/drain placement.  He is NPO. He refused his scheduled Lovenox injection. He takes not other blood thinners.  Past Medical History:  Diagnosis Date  . Colonic diverticular abscess 09/25/2016  . GERD (gastroesophageal reflux disease)   . Gluten intolerance   . History of kidney stones   . Hypothyroid   . UPJ obstruction, congenital     Past Surgical History:  Procedure Laterality Date  . CYSTOSCOPY WITH URETEROSCOPY, STONE BASKETRY AND STENT PLACEMENT     "I've had at least 3 stents"  . ENDOPYELOTOMY     [name]/notes 07/19/2013  . 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 LITHOTRIPSY  2003 or 2004 X 2  . NEPHROLITHOTOMY Left  07/23/2013   Procedure: LEFT NEPHROLITHOTOMY PERCUTANEOUS;  Surgeon: Bjorn PippinJohn Wrenn, MD;  Location: [facility];  Service: Urology;  Laterality: Left;  . URETEROSCOPY  2003 or 2003   x 1    Allergies: Gluten meal  Medications: Prior to Admission medications   Medication Sig Start Date End Date Taking? Authorizing Provider  acidophilus (RISAQUAD) CAPS capsule Take 1 capsule by mouth daily.    [provider]  cholecalciferol (VITAMIN D) 1000 UNITS tablet Take 1,000 Units by mouth daily.    [provider]  Digestive Enzymes (DIGESTIVE ENZYME PO) Take 1 tablet by mouth daily.    [provider]  docusate sodium (COLACE) 100 MG capsule Take 1 capsule (100 mg total) by mouth 2 (two) times daily. 07/24/13   Bjorn PippinWrenn, John, MD  HYDROcodone-acetaminophen (NORCO/VICODIN) 5-325 MG per tablet Take 1 tablet by mouth every 6 (six) hours as needed for moderate pain.    [provider]  levothyroxine (SYNTHROID, LEVOTHROID) 100 MCG tablet Take 100 mcg by mouth daily before breakfast.    [provider]  ondansetron (ZOFRAN ODT) 4 MG disintegrating tablet Take 1 tablet (4 mg total) by mouth every 8 (eight) hours as needed for nausea or vomiting. 07/20/13   Bjorn PippinWrenn, John, MD  OVER THE COUNTER MEDICATION Take 1 tablet by mouth daily. CELLULASE  [provider]  OVER THE COUNTER MEDICATION Take 1 tablet by mouth daily. [facility]    [provider]  OVER THE COUNTER MEDICATION Take 1 tablet by mouth daily. CLEANZYNE    [provider]  oxyCODONE-acetaminophen (ROXICET) 5-325 MG per tablet Take 1 tablet by mouth every 4 (four) hours as needed for severe pain. 07/20/13   Bjorn Pippin, MD     Family History  Problem Relation Age of Onset  . Diabetes Paternal Grandfather   . Cancer Paternal Grandfather     Social History   Social History  . Marital status: Divorced    Spouse name: N/A  . Number of children: N/A  . Years of education: N/A   Occupational  History  . student [employer]   Social History Main Topics  . Smoking status: Former Smoker    Types: Cigarettes  . Smokeless tobacco: Never Used     Comment: social only in high school  . Alcohol use 1.8 oz/week    3 Cans of beer per week  . Drug use: No  . Sexual activity: Yes   Other Topics Concern  . None   Social History Narrative  . None    Review of Systems: A 12 point ROS discussed Review of Systems  Constitutional: Negative.   HENT: Negative.   Respiratory: Negative.   Cardiovascular: Negative.   Gastrointestinal: Positive for abdominal pain. Negative for nausea and vomiting.  Genitourinary: Negative.   Musculoskeletal: Negative.   Skin: Negative.   Neurological: Negative.   Hematological: Negative.   Psychiatric/Behavioral: Negative.     Vital Signs: BP 115/82 (BP Location: Left Arm)   Pulse 60   Temp 98.6 F (37 C) (Oral)   Resp 18   Ht 5\' 8"  (1.727 m)   Wt 215 lb (97.5 kg)   SpO2 96%   BMI 32.69 kg/m   Physical Exam  Constitutional: He is oriented to person, place, and time. He appears well-developed and well-nourished.  HENT:  Head: Normocephalic and atraumatic.  Eyes: EOM are normal.  Neck: Normal range of motion.  Cardiovascular: Normal rate, regular rhythm and normal heart sounds.   Pulmonary/Chest: Effort normal and breath sounds normal. No respiratory distress. He has no wheezes.  Abdominal: Soft. He exhibits no distension. There is tenderness in the left lower quadrant.    Musculoskeletal: Normal range of motion.  Neurological: He is alert and oriented to person, place, and time.  Skin: Skin is warm and dry.  Psychiatric: He has a normal mood and affect. His behavior is normal. Judgment and thought content normal.  Vitals reviewed.   Mallampati Score:  MD Evaluation Airway: WNL Heart: WNL Abdomen: WNL Chest/ Lungs: WNL ASA  Classification: 2 Mallampati/Airway Score: One  Imaging: Ct Abdomen Pelvis W Contrast  Result Date:  09/25/2016 CLINICAL DATA:  Left lower quadrant abdominal pain for 12 days. Night sweats. EXAM: CT ABDOMEN AND PELVIS WITH CONTRAST TECHNIQUE: Multidetector CT imaging of the abdomen and pelvis was performed using the standard protocol following bolus administration of intravenous contrast. CONTRAST:  ISOVUE-300 IOPAMIDOL (ISOVUE-300) INJECTION 61% COMPARISON:  Multiple exams, including 07/19/2013 CT scan FINDINGS: Lower chest: Unremarkable Hepatobiliary: Mild diffuse hepatic steatosis is suspected. Otherwise unremarkable. Pancreas: Unremarkable Spleen: Unremarkable Adrenals/Urinary Tract: Adrenal glands normal. 9 mm fluid density cyst of the right kidney upper pole. Left lower renal pelvis 1.6 by 0.8 by 0.9 cm calculus along with several smaller caliceal calculi in the left kidney lower pole. Subtle wall thickening along the left  renal pelvis inferiorly is probably secondary to inflammation from the adjacent stone. Urinary bladder unremarkable. Stomach/Bowel: There is an abscess spanning from the wall of the distal sigmoid colon into the adjacent paracolic adipose tissues, with several small internal loculations of localized gas, total size of this process approximately 3.0 by 4.3 by 3.5 cm. This is likely a diverticular abscess, less likely to be an ulceration of the wall of the colon with paracolic abscess. The more cephalad portions did not have a sharply defined enhancing wall both the portion of the abscess in the wall of the colon is more clearly defined. There is surrounding inflammatory stranding. Several other sigmoid colon diverticula are present. Vascular/Lymphatic: Unremarkable Reproductive: Unremarkable Other: No supplemental non-categorized findings. Musculoskeletal: 6 mm grade 1 anterolisthesis of L5 on S1 associated with chronic L5 pars defects. Moderate left and mild right foraminal stenosis at this level results. Small umbilical hernia contains adipose tissue. IMPRESSION: 1. An abscess spans  from the wall of the distal sigmoid colon into the adjacent paracolic fat, with several locules of locally contained extraluminal gas. This is likely a diverticular abscess in the setting of diverticulitis. Less likely to be an ulceration or ulcerative mass of the colon wall leading to colon wall and paracolic abscess. Total size of the abnormal collection of gas and fluid approximately 3.0 by 4.3 by 3.5 cm although a significant portion of this is within the wall of the sigmoid colon. 2. Mild diffuse hepatic steatosis. 3. Nonobstructive left kidney lower pole calculi including a larger 1.6 cm calculus along the lower renal pelvis. 4. 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 Electronically Signed   By: Gaylyn Rong M.D.   On: 09/25/2016 15:07    Labs:  CBC:  Recent Labs  09/25/16 1842 09/26/16 0554  WBC 9.5 9.5  HGB 14.2 13.7  HCT 40.7 40.0  PLT 184 175    COAGS:  Recent Labs  09/26/16 0729  INR 1.18    BMP:  Recent Labs  09/25/16 1842 09/26/16 0554  NA 137 137  K 4.1 3.9  CL 102 105  CO2 27 23  GLUCOSE 111* 106*  BUN 11 12  CALCIUM 9.4 8.4*  CREATININE 0.96 0.88  GFRNONAA >60 >60  GFRAA >60 >60    LIVER FUNCTION TESTS: No results for input(s): BILITOT, AST, ALT, ALKPHOS, PROT, ALBUMIN in the last 8760 hours.  TUMOR MARKERS: No results for input(s): AFPTM, CEA, CA199, CHROMGRNA in the last 8760 hours.  Assessment and Plan:  Diverticular abscess  Will proceed with CT guided drainage/drain placement today by Dr. Bonnielee Haff.  Risks and Benefits discussed with the patient including bleeding, infection, damage to adjacent structures, bowel perforation/fistula connection, and sepsis.  All of the patient's questions were answered, patient is agreeable to proceed. Consent signed and in chart.  Thank you for this interesting consult.  I greatly enjoyed meeting STACIE KNUTZEN and look forward to participating in their care.  A copy of this report was sent to the requesting provider on this date.  Electronically Signed: Gwynneth Macleod, PA-C 09/26/2016, 9:08 AM   I spent a total of 40 Minutes in face to face in clinical consultation, greater than 50% of which was counseling/coordinating care for abscess drain.

## 2016-09-27 NOTE — Progress Notes (Signed)
Patient of complained of pain 6/10 but refused to take 4mg  of Morphine as ordered. Requested to have only 2mg . Remaining 2 mg wasted in sink,witnessed by Venida JarvisJoanK RN.

## 2016-09-27 NOTE — Progress Notes (Signed)
[facility] Progress Note     Subjective: CC: abdominal pain. Complaining of night sweats. Pain improved compared to yesterday, reports soreness around drain site. Nausea is improving. Denies vomiting. Denies BM.   Febrile (TMAX 102.4) overnight.  Other vitals WNL Objective: Vital signs in last 24 hours: Temp:  [98.7 F (37.1 C)-102.4 F (39.1 C)] 99.1 F (37.3 C) (06/14 0530) Pulse Rate:  [60-95] 76 (06/14 0530) Resp:  [12-27] 16 (06/14 0530) BP: (113-137)/(66-83) 114/67 (06/14 0530) SpO2:  [96 %-100 %] 98 % (06/14 0530) Last BM Date: 09/22/16  Intake/Output from previous day: 06/13 0701 - 06/14 0700 In: 2060 [P.O.:660; I.V.:1200; IV Piggyback:200] Out: 700 [Urine:650; Drains:50] Intake/Output this shift: No intake/output data recorded.  PE: Gen:  Alert, NAD, pleasant HEENT: pupils equal and round, non-icteric sclerae  Card:  Regular rate and rhythm, pedal pulses 2+ BL Pulm:  Normal effort, clear to auscultation bilaterally Abd: Soft, TTP suprapubic region and LLQ - only minimally improved compared to yesterday, No peritonitis.   Drain: 50 cc/24h Skin: warm and dry, no rashes  Psych: A&Ox3   Lab Results:   Recent Labs  09/25/16 1842 09/26/16 0554  WBC 9.5 9.5  HGB 14.2 13.7  HCT 40.7 40.0  PLT 184 175   BMET  Recent Labs  09/25/16 1842 09/26/16 0554  NA 137 137  K 4.1 3.9  CL 102 105  CO2 27 23  GLUCOSE 111* 106*  BUN 11 12  CREATININE 0.96 0.88  CALCIUM 9.4 8.4*   PT/INR  Recent Labs  09/26/16 0729  LABPROT 15.1  INR 1.18   CMP     Component Value Date/Time   NA 137 09/26/2016 0554   K 3.9 09/26/2016 0554   CL 105 09/26/2016 0554   CO2 23 09/26/2016 0554   GLUCOSE 106 (H) 09/26/2016 0554   BUN 12 09/26/2016 0554   CREATININE 0.88 09/26/2016 0554   CALCIUM 8.4 (L) 09/26/2016 0554   PROT 6.9 07/24/2013 2245   ALBUMIN 4.4 07/24/2013 2245   AST 35 07/24/2013 2245   ALT 47 07/24/2013 2245   ALKPHOS 49 07/24/2013 2245    BILITOT 0.7 07/24/2013 2245   GFRNONAA >60 09/26/2016 0554   GFRAA >60 09/26/2016 0554   Lipase  No results found for: LIPASE     Studies/Results: Ct Abdomen Pelvis W Contrast  Result Date: 09/25/2016 CLINICAL DATA:  Left lower quadrant abdominal pain for 12 days. Night sweats. EXAM: CT ABDOMEN AND PELVIS WITH CONTRAST TECHNIQUE: Multidetector CT imaging of the abdomen and pelvis was performed using the standard protocol following bolus administration of intravenous contrast. CONTRAST:  ISOVUE-300 IOPAMIDOL (ISOVUE-300) INJECTION 61% COMPARISON:  Multiple exams, including 07/19/2013 CT scan FINDINGS: Lower chest: Unremarkable Hepatobiliary: Mild diffuse hepatic steatosis is suspected. Otherwise unremarkable. Pancreas: Unremarkable Spleen: Unremarkable Adrenals/Urinary Tract: Adrenal glands normal. 9 mm fluid density cyst of the right kidney upper pole. Left lower renal pelvis 1.6 by 0.8 by 0.9 cm calculus along with several smaller caliceal calculi in the left kidney lower pole. Subtle wall thickening along the left renal pelvis inferiorly is probably secondary to inflammation from the adjacent stone. Urinary bladder unremarkable. Stomach/Bowel: There is an abscess spanning from the wall of the distal sigmoid colon into the adjacent paracolic adipose tissues, with several small internal loculations of localized gas, total size of this process approximately 3.0 by 4.3 by 3.5 cm. This is likely a diverticular abscess, less likely to be an ulceration of the wall of the colon with paracolic  abscess. The more cephalad portions did not have a sharply defined enhancing wall both the portion of the abscess in the wall of the colon is more clearly defined. There is surrounding inflammatory stranding. Several other sigmoid colon diverticula are present. Vascular/Lymphatic: Unremarkable Reproductive: Unremarkable Other: No supplemental non-categorized findings. Musculoskeletal: 6 mm grade 1 anterolisthesis  of L5 on S1 associated with chronic L5 pars defects. Moderate left and mild right foraminal stenosis at this level results. Small umbilical hernia contains adipose tissue. IMPRESSION: 1. An abscess spans from the wall of the distal sigmoid colon into the adjacent paracolic fat, with several locules of locally contained extraluminal gas. This is likely a diverticular abscess in the setting of diverticulitis. Less likely to be an ulceration or ulcerative mass of the colon wall leading to colon wall and paracolic abscess. Total size of the abnormal collection of gas and fluid approximately 3.0 by 4.3 by 3.5 cm although a significant portion of this is within the wall of the sigmoid colon. 2. Mild diffuse hepatic steatosis. 3. Nonobstructive left kidney lower pole calculi including a larger 1.6 cm calculus along the lower renal pelvis. 4. 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 Electronically Signed   By: Gaylyn RongWalter  Liebkemann M.D.   On: 09/25/2016 15:07   Ct Image Guided Drainage By Percutaneous Catheter  Result Date: 09/26/2016 INDICATION: Diverticulitis.  Left lower quadrant abscess. EXAM: CT GUIDED DRAINAGE OF A PELVIC ABSCESS MEDICATIONS: The patient is currently admitted to the hospital and receiving intravenous antibiotics. The antibiotics were administered within an appropriate time frame prior to the initiation of the procedure. ANESTHESIA/SEDATION: One hundred fifty mg IV Versed Three mcg IV Fentanyl Moderate Sedation Time:  18 The patient was continuously monitored during the procedure by the interventional radiology nurse under my direct supervision. COMPLICATIONS: None immediate. TECHNIQUE: Informed written consent was obtained from the patient after a thorough discussion of the procedural risks, benefits and alternatives. All questions were addressed. Maximal Sterile Barrier Technique was utilized  including caps, mask, sterile gowns, sterile gloves, sterile drape, hand hygiene and skin antiseptic. A timeout was performed prior to the initiation of the procedure. PROCEDURE: The lower abdomen was prepped with ChloraPrep in a sterile fashion, and a sterile drape was applied covering the operative field. A sterile gown and sterile gloves were used for the procedure. Local anesthesia was provided with 1% Lidocaine. Under CT guidance, an 18 gauge needle was advanced into the left pelvic abscess. It was removed over an Amplatz wire. A 10 French dilator followed by a 10 [place] drain were inserted. It was looped and string fixed in the abscess cavity. It was sewn to the skin. Frank pus was aspirated. FINDINGS: Imaging documents 5510 French drain placement into a left pelvic abscess. IMPRESSION: Successful 10 French left pelvic abscess drain. Electronically Signed   By: Jolaine ClickArthur  Hoss M.D.   On: 09/26/2016 15:29    Anti-infectives: Anti-infectives    Start     Dose/Rate Route Frequency Ordered Stop   09/25/16 1900  ciprofloxacin (CIPRO) IVPB 400 mg     400 mg 200 mL/hr over 60 Minutes Intravenous Every 12 hours 09/25/16 1808     09/25/16 1900  metroNIDAZOLE (FLAGYL) IVPB 500 mg     500 mg 100 mL/hr over 60 Minutes Intravenous Every 8 hours 09/25/16 1808       Assessment/Plan Diverticulitis with abscess - s/p IR placement of 52F pelvic  drain 09/26/16  - still moderately tender over lower abdomen, fevers <24 ago, night sweats - pt placed on a regular diet yesterday after IR procedure. Based on patients pain and fevers I have decreased his diet to clear liquids; further dietary changes per GI. - pain control - tylenol PRN for fever   FEN: clears, IVF ID: cipro/flagyl VTE: SCD's, Lovenox    LOS: 2 days    Shawn Gay , [facility] 09/27/2016, 7:39 AM Pager: [phone] Consults: [phone] Mon-Fri 7:00 am-4:30 pm Sat-Sun 7:00 am-11:30 am

## 2016-09-27 NOTE — Progress Notes (Signed)
Subjective: Feeling better.  No complaints.  He has more pain when he is sitting up.  Objective: Vital signs in last 24 hours: Temp:  [98.7 F (37.1 C)-102.4 F (39.1 C)] 99.1 F (37.3 C) (06/14 1719) Pulse Rate:  [69-95] 69 (06/14 1719) Resp:  [14-16] 16 (06/14 1719) BP: (113-140)/(66-72) 140/72 (06/14 1719) SpO2:  [97 %-98 %] 97 % (06/14 1719) Last BM Date: 09/22/16  Intake/Output from previous day: 06/13 0701 - 06/14 0700 In: 2060 [P.O.:660; I.V.:1200; IV Piggyback:200] Out: 700 [Urine:650; Drains:50] Intake/Output this shift: No intake/output data recorded.  General appearance: alert and no distress GI: decreased LLQ tenderness  Lab Results:  Recent Labs  09/25/16 1842 09/26/16 0554  WBC 9.5 9.5  HGB 14.2 13.7  HCT 40.7 40.0  PLT 184 175   BMET  Recent Labs  09/25/16 1842 09/26/16 0554  NA 137 137  K 4.1 3.9  CL 102 105  CO2 27 23  GLUCOSE 111* 106*  BUN 11 12  CREATININE 0.96 0.88  CALCIUM 9.4 8.4*   LFT No results for input(s): PROT, ALBUMIN, AST, ALT, ALKPHOS, BILITOT, BILIDIR, IBILI in the last 72 hours. PT/INR  Recent Labs  09/26/16 0729  LABPROT 15.1  INR 1.18   Hepatitis Panel No results for input(s): HEPBSAG, HCVAB, HEPAIGM, HEPBIGM in the last 72 hours. C-Diff No results for input(s): CDIFFTOX in the last 72 hours. Fecal Lactopherrin No results for input(s): FECLLACTOFRN in the last 72 hours.  Studies/Results: Ct Image Guided Drainage By Percutaneous Catheter  Result Date: 09/26/2016 INDICATION: Diverticulitis.  Left lower quadrant abscess. EXAM: CT GUIDED DRAINAGE OF A PELVIC ABSCESS MEDICATIONS: The patient is currently admitted to the hospital and receiving intravenous antibiotics. The antibiotics were administered within an appropriate time frame prior to the initiation of the procedure. ANESTHESIA/SEDATION: One hundred fifty mg IV Versed Three mcg IV Fentanyl Moderate Sedation Time:  18 The patient was continuously monitored  during the procedure by the interventional radiology nurse under my direct supervision. COMPLICATIONS: None immediate. TECHNIQUE: Informed written consent was obtained from the patient after a thorough discussion of the procedural risks, benefits and alternatives. All questions were addressed. Maximal Sterile Barrier Technique was utilized including caps, mask, sterile gowns, sterile gloves, sterile drape, hand hygiene and skin antiseptic. A timeout was performed prior to the initiation of the procedure. PROCEDURE: The lower abdomen was prepped with ChloraPrep in a sterile fashion, and a sterile drape was applied covering the operative field. A sterile gown and sterile gloves were used for the procedure. Local anesthesia was provided with 1% Lidocaine. Under CT guidance, an 18 gauge needle was advanced into the left pelvic abscess. It was removed over an Amplatz wire. A 10 French dilator followed by a 10 [place] drain were inserted. It was looped and string fixed in the abscess cavity. It was sewn to the skin. Frank pus was aspirated. FINDINGS: Imaging documents 410 French drain placement into a left pelvic abscess. IMPRESSION: Successful 10 French left pelvic abscess drain. Electronically Signed   By: Jolaine ClickArthur  Hoss M.D.   On: 09/26/2016 15:29    Medications:  Scheduled: . enoxaparin (LOVENOX) injection  40 mg Subcutaneous Q24H   Continuous: . sodium chloride 100 mL/hr (09/27/16 1023)  . ciprofloxacin Stopped (09/27/16 1836)  . metronidazole 500 mg (09/27/16 1836)    Assessment/Plan: 1) Sigmoid colon abscess. 2) LLQ abdominal pain.   He is improving.  Pain has decreased with palpation.  He did spike a temp to 102, but he is afebrile  at this time.  I am appreciative of IR intervention with the drain.  Plan: 1) Continue with Cipro and Flagyl. 2) Advance diet as tolerated. 3) D/C home when IR deems it appropriate.  LOS: 2 days   Shawn Gay D 09/27/2016, 8:27 PM

## 2016-09-27 NOTE — Progress Notes (Signed)
Referring Physician(s): Blackman,D  Supervising Physician: Simonne Come  Patient Status:  [facility] - In-pt  Chief Complaint:  Pelvic abscess  Subjective:  Pt feels a little better since pelvic drain placed yesterday; has some LLQ soreness; currently without N/V; tol liquids ok; had fever/sweats last night; no BM, passing gas  Allergies: Gluten meal  Medications: Prior to Admission medications   Medication Sig Start Date End Date Taking? Authorizing Provider  cholecalciferol (VITAMIN D) 1000 UNITS tablet Take 1,000 Units by mouth daily.   Yes [provider]  docusate sodium (COLACE) 100 MG capsule Take 1 capsule (100 mg total) by mouth 2 (two) times daily. 07/24/13  Yes Bjorn Pippin, MD  levothyroxine (SYNTHROID, LEVOTHROID) 100 MCG tablet Take 137 mcg by mouth daily before breakfast.    Yes [provider]  liothyronine (CYTOMEL) 5 MCG tablet Take 5 mcg by mouth daily.   Yes [provider]  ondansetron (ZOFRAN ODT) 4 MG disintegrating tablet Take 1 tablet (4 mg total) by mouth every 8 (eight) hours as needed for nausea or vomiting. 07/20/13  Yes Bjorn Pippin, MD  oxyCODONE-acetaminophen (ROXICET) 5-325 MG per tablet Take 1 tablet by mouth every 4 (four) hours as needed for severe pain. Patient not taking: Reported on 09/26/2016 07/20/13   Bjorn Pippin, MD     Vital Signs: BP 114/67 (BP Location: Left Arm)   Pulse 76   Temp 99.1 F (37.3 C) (Oral)   Resp 16   Ht 5\' 8"  (1.727 m)   Wt 215 lb (97.5 kg)   SpO2 98%   BMI 32.69 kg/m   Physical Exam awake/alert; ant pelvic drain intact, output 50 cc blood tinged /serous fluid; cx pend; insertion site ok, mildly tender  Imaging: Ct Abdomen Pelvis W Contrast  Result Date: 09/25/2016 CLINICAL DATA:  Left lower quadrant abdominal pain for 12 days. Night sweats. EXAM: CT ABDOMEN AND PELVIS WITH CONTRAST TECHNIQUE: Multidetector CT imaging of the abdomen and pelvis was performed using the standard protocol  following bolus administration of intravenous contrast. CONTRAST:  ISOVUE-300 IOPAMIDOL (ISOVUE-300) INJECTION 61% COMPARISON:  Multiple exams, including 07/19/2013 CT scan FINDINGS: Lower chest: Unremarkable Hepatobiliary: Mild diffuse hepatic steatosis is suspected. Otherwise unremarkable. Pancreas: Unremarkable Spleen: Unremarkable Adrenals/Urinary Tract: Adrenal glands normal. 9 mm fluid density cyst of the right kidney upper pole. Left lower renal pelvis 1.6 by 0.8 by 0.9 cm calculus along with several smaller caliceal calculi in the left kidney lower pole. Subtle wall thickening along the left renal pelvis inferiorly is probably secondary to inflammation from the adjacent stone. Urinary bladder unremarkable. Stomach/Bowel: There is an abscess spanning from the wall of the distal sigmoid colon into the adjacent paracolic adipose tissues, with several small internal loculations of localized gas, total size of this process approximately 3.0 by 4.3 by 3.5 cm. This is likely a diverticular abscess, less likely to be an ulceration of the wall of the colon with paracolic abscess. The more cephalad portions did not have a sharply defined enhancing wall both the portion of the abscess in the wall of the colon is more clearly defined. There is surrounding inflammatory stranding. Several other sigmoid colon diverticula are present. Vascular/Lymphatic: Unremarkable Reproductive: Unremarkable Other: No supplemental non-categorized findings. Musculoskeletal: 6 mm grade 1 anterolisthesis of L5 on S1 associated with chronic L5 pars defects. Moderate left and mild right foraminal stenosis at this level results. Small umbilical hernia contains adipose tissue. IMPRESSION: 1. An abscess spans from the wall of the distal sigmoid colon  into the adjacent paracolic fat, with several locules of locally contained extraluminal gas. This is likely a diverticular abscess in the setting of diverticulitis. Less likely to be an  ulceration or ulcerative mass of the colon wall leading to colon wall and paracolic abscess. Total size of the abnormal collection of gas and fluid approximately 3.0 by 4.3 by 3.5 cm although a significant portion of this is within the wall of the sigmoid colon. 2. Mild diffuse hepatic steatosis. 3. Nonobstructive left kidney lower pole calculi including a larger 1.6 cm calculus along the lower renal pelvis. 4. 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 Electronically Signed   By: Gaylyn Rong M.D.   On: 09/25/2016 15:07   Ct Image Guided Drainage By Percutaneous Catheter  Result Date: 09/26/2016 INDICATION: Diverticulitis.  Left lower quadrant abscess. EXAM: CT GUIDED DRAINAGE OF A PELVIC ABSCESS MEDICATIONS: The patient is currently admitted to the hospital and receiving intravenous antibiotics. The antibiotics were administered within an appropriate time frame prior to the initiation of the procedure. ANESTHESIA/SEDATION: One hundred fifty mg IV Versed Three mcg IV Fentanyl Moderate Sedation Time:  18 The patient was continuously monitored during the procedure by the interventional radiology nurse under my direct supervision. COMPLICATIONS: None immediate. TECHNIQUE: Informed written consent was obtained from the patient after a thorough discussion of the procedural risks, benefits and alternatives. All questions were addressed. Maximal Sterile Barrier Technique was utilized including caps, mask, sterile gowns, sterile gloves, sterile drape, hand hygiene and skin antiseptic. A timeout was performed prior to the initiation of the procedure. PROCEDURE: The lower abdomen was prepped with ChloraPrep in a sterile fashion, and a sterile drape was applied covering the operative field. A sterile gown and sterile gloves were used for the procedure. Local anesthesia was provided with 1% Lidocaine. Under CT guidance,  an 18 gauge needle was advanced into the left pelvic abscess. It was removed over an Amplatz wire. A 10 French dilator followed by a 10 [place] drain were inserted. It was looped and string fixed in the abscess cavity. It was sewn to the skin. Frank pus was aspirated. FINDINGS: Imaging documents 76 French drain placement into a left pelvic abscess. IMPRESSION: Successful 10 French left pelvic abscess drain. Electronically Signed   By: Jolaine Click M.D.   On: 09/26/2016 15:29    Labs:  CBC:  Recent Labs  09/25/16 1842 09/26/16 0554  WBC 9.5 9.5  HGB 14.2 13.7  HCT 40.7 40.0  PLT 184 175    COAGS:  Recent Labs  09/26/16 0729  INR 1.18    BMP:  Recent Labs  09/25/16 1842 09/26/16 0554  NA 137 137  K 4.1 3.9  CL 102 105  CO2 27 23  GLUCOSE 111* 106*  BUN 11 12  CALCIUM 9.4 8.4*  CREATININE 0.96 0.88  GFRNONAA >60 >60  GFRAA >60 >60    LIVER FUNCTION TESTS: No results for input(s): BILITOT, AST, ALT, ALKPHOS, PROT, ALBUMIN in the last 8760 hours.  Assessment and Plan: S/p pelvic/probable diverticular abscess drain 6/13: fluid cx pend; no new labs; cont drain irrigation/monitor output closely; obtain f/u CT/poss drain injection within 1 week of placement; send drain fluid for cytology; diet per CCS/GI   Electronically Signed: D. Jeananne Rama, PA-C 09/27/2016, 11:09 AM   I spent a total of 15 minutes at the the patient's bedside AND on the patient's hospital floor or  unit, greater than 50% of which was counseling/coordinating care for pelvic abscess drain    Patient ID: Shawn ForestJoshua M Gay, male   DOB: 06/27/1975, 41 y.o.   MRN: [id number]

## 2016-09-27 NOTE — Care Management Note (Signed)
Case Management Note  Patient Details  Name: Shawn ForestJoshua M Bernardy MRN: [id number] Date of Birth: 11/04/1975  Subjective/Objective:                    Action/Plan:  pelvic/probable diverticular abscess drain 6/13 Expected Discharge Date:                  Expected Discharge Plan:  Home/Self Care  In-House Referral:     Discharge planning Services     Post Acute Care Choice:    Choice offered to:     DME Arranged:    DME Agency:     HH Arranged:    HH Agency:     Status of Service:  In process, will continue to follow  If discussed at Long Length of Stay Meetings, dates discussed:    Additional Comments:  Kingsley PlanWile, Darcy Barbara Marie, RN 09/27/2016, 11:30 AM

## 2016-09-28 LAB — CBC
HCT: 37.7 % — ABNORMAL LOW (ref 39.0–52.0)
HEMOGLOBIN: 13.2 g/dL (ref 13.0–17.0)
MCH: 30.4 pg (ref 26.0–34.0)
MCHC: 35 g/dL (ref 30.0–36.0)
MCV: 86.9 fL (ref 78.0–100.0)
Platelets: 215 10*3/uL (ref 150–400)
RBC: 4.34 MIL/uL (ref 4.22–5.81)
RDW: 12 % (ref 11.5–15.5)
WBC: 9.5 10*3/uL (ref 4.0–10.5)

## 2016-09-28 LAB — BASIC METABOLIC PANEL
Anion gap: 8 (ref 5–15)
BUN: 10 mg/dL (ref 6–20)
CHLORIDE: 107 mmol/L (ref 101–111)
CO2: 22 mmol/L (ref 22–32)
CREATININE: 0.85 mg/dL (ref 0.61–1.24)
Calcium: 8.8 mg/dL — ABNORMAL LOW (ref 8.9–10.3)
GFR calc Af Amer: >60 mL/min (ref >60)
GFR calc non Af Amer: >60 mL/min (ref >60)
Glucose, Bld: 99 mg/dL (ref 65–99)
Potassium: 3.8 mmol/L (ref 3.5–5.1)
SODIUM: 137 mmol/L (ref 135–145)

## 2016-09-28 NOTE — Progress Notes (Signed)
[facility] Progress Note     Subjective: CC:  Abdominal pain improved. Still with mild lower abdominal soreness. Tolerating PO. Night sweats persist. Fever improving.  Objective: Vital signs in last 24 hours: Temp:  [98.7 F (37.1 C)-100.9 F (38.3 C)] 98.7 F (37.1 C) (06/15 0645) Pulse Rate:  [69-81] 81 (06/15 0528) Resp:  [16-18] 18 (06/15 0528) BP: (112-140)/(70-76) 112/70 (06/15 0528) SpO2:  [96 %-98 %] 96 % (06/15 0528) Last BM Date: 09/26/16  Intake/Output from previous day: 06/14 0701 - 06/15 0700 In: 2293.3 [P.O.:360; I.V.:1623.3; IV Piggyback:300] Out: 45 [Drains:45] Intake/Output this shift: No intake/output data recorded.  PE: Gen: Alert, NAD, pleasant HEENT: pupils equal and round, non-icteric sclerae  Card: Regular rate and rhythm, pedal pulses 2+ BL Pulm: Normal effort, clear to auscultation bilaterally Abd: Soft, mild suprapubic tenderness, +BS, No peritonitis.              Drain: 45 cc/24h Skin: warm and dry, no rashes  Psych: A&Ox3   Lab Results:   Recent Labs  09/26/16 0554 09/28/16 0556  WBC 9.5 9.5  HGB 13.7 13.2  HCT 40.0 37.7*  PLT 175 215   BMET  Recent Labs  09/26/16 0554 09/28/16 0556  NA 137 137  K 3.9 3.8  CL 105 107  CO2 23 22  GLUCOSE 106* 99  BUN 12 10  CREATININE 0.88 0.85  CALCIUM 8.4* 8.8*   PT/INR  Recent Labs  09/26/16 0729  LABPROT 15.1  INR 1.18   CMP     Component Value Date/Time   NA 137 09/28/2016 0556   K 3.8 09/28/2016 0556   CL 107 09/28/2016 0556   CO2 22 09/28/2016 0556   GLUCOSE 99 09/28/2016 0556   BUN 10 09/28/2016 0556   CREATININE 0.85 09/28/2016 0556   CALCIUM 8.8 (L) 09/28/2016 0556   PROT 6.9 07/24/2013 2245   ALBUMIN 4.4 07/24/2013 2245   AST 35 07/24/2013 2245   ALT 47 07/24/2013 2245   ALKPHOS 49 07/24/2013 2245   BILITOT 0.7 07/24/2013 2245   GFRNONAA >60 09/28/2016 0556   GFRAA >60 09/28/2016 0556   Lipase  No results found for:  LIPASE     Studies/Results: Ct Image Guided Drainage By Percutaneous Catheter  Result Date: 09/26/2016 INDICATION: Diverticulitis.  Left lower quadrant abscess. EXAM: CT GUIDED DRAINAGE OF A PELVIC ABSCESS MEDICATIONS: The patient is currently admitted to the hospital and receiving intravenous antibiotics. The antibiotics were administered within an appropriate time frame prior to the initiation of the procedure. ANESTHESIA/SEDATION: One hundred fifty mg IV Versed Three mcg IV Fentanyl Moderate Sedation Time:  18 The patient was continuously monitored during the procedure by the interventional radiology nurse under my direct supervision. COMPLICATIONS: None immediate. TECHNIQUE: Informed written consent was obtained from the patient after a thorough discussion of the procedural risks, benefits and alternatives. All questions were addressed. Maximal Sterile Barrier Technique was utilized including caps, mask, sterile gowns, sterile gloves, sterile drape, hand hygiene and skin antiseptic. A timeout was performed prior to the initiation of the procedure. PROCEDURE: The lower abdomen was prepped with ChloraPrep in a sterile fashion, and a sterile drape was applied covering the operative field. A sterile gown and sterile gloves were used for the procedure. Local anesthesia was provided with 1% Lidocaine. Under CT guidance, an 18 gauge needle was advanced into the left pelvic abscess. It was removed over an Amplatz wire. A 10 French dilator followed by a 10 [place] drain were inserted. It  was looped and string fixed in the abscess cavity. It was sewn to the skin. Frank pus was aspirated. FINDINGS: Imaging documents 24 French drain placement into a left pelvic abscess. IMPRESSION: Successful 10 French left pelvic abscess drain. Electronically Signed   By: Jolaine Click M.D.   On: 09/26/2016 15:29    Anti-infectives: Anti-infectives    Start     Dose/Rate Route Frequency Ordered Stop   09/25/16 1900   ciprofloxacin (CIPRO) IVPB 400 mg     400 mg 200 mL/hr over 60 Minutes Intravenous Every 12 hours 09/25/16 1808     09/25/16 1900  metroNIDAZOLE (FLAGYL) IVPB 500 mg     500 mg 100 mL/hr over 60 Minutes Intravenous Every 8 hours 09/25/16 1808       Assessment/Plan Diverticulitis with abscess - s/p IR placement of 17F pelvic drain 09/26/16  - fevers improving,  tylenol PRN for fever - tolerating diet, advance as tolerated - discharge per primary team. Will require outpatient GI and IR follow up. Do not recommend surgical follow up after this admission, given it is his first episode of diverticulitis.  General surgery will sign off. Please call as needed.   FEN: full liquids, advance diet as tolerated, IVF ID: cipro/flagyl VTE: SCD's, Lovenox    LOS: 3 days    Shawn Gay , [facility] 09/28/2016, 8:45 AM Pager: [phone] Consults: [phone] Mon-Fri 7:00 am-4:30 pm Sat-Sun 7:00 am-11:30 am

## 2016-09-28 NOTE — Progress Notes (Signed)
Referring Physician(s): Dr Chip BoerP Hung  Supervising Physician: Gilmer MorWagner, Jaime  Patient Status:  [facility]  Chief Complaint:  Pelvic abscess drain placed 6/13  Subjective:  Diverticulitis with abscess draining well 45 cc output yesterday 25 cc in JP Blood tinged serous color Pain is less in abd Pt is up in room T max 100.4 this am   Allergies: Gluten meal  Medications: Prior to Admission medications   Medication Sig Start Date End Date Taking? Authorizing Provider  cholecalciferol (VITAMIN D) 1000 UNITS tablet Take 1,000 Units by mouth daily.   Yes [provider]  docusate sodium (COLACE) 100 MG capsule Take 1 capsule (100 mg total) by mouth 2 (two) times daily. 07/24/13  Yes Bjorn PippinWrenn, John, MD  levothyroxine (SYNTHROID, LEVOTHROID) 100 MCG tablet Take 137 mcg by mouth daily before breakfast.    Yes [provider]  liothyronine (CYTOMEL) 5 MCG tablet Take 5 mcg by mouth daily.   Yes [provider]  ondansetron (ZOFRAN ODT) 4 MG disintegrating tablet Take 1 tablet (4 mg total) by mouth every 8 (eight) hours as needed for nausea or vomiting. 07/20/13  Yes Bjorn PippinWrenn, John, MD  oxyCODONE-acetaminophen (ROXICET) 5-325 MG per tablet Take 1 tablet by mouth every 4 (four) hours as needed for severe pain. Patient not taking: Reported on 09/26/2016 07/20/13   Bjorn PippinWrenn, John, MD     Vital Signs: BP 112/70 (BP Location: Left Arm)   Pulse 81   Temp 98.7 F (37.1 C) (Oral)   Resp 18   Ht 5\' 8"  (1.727 m)   Wt 215 lb (97.5 kg)   SpO2 96%   BMI 32.69 kg/m   Physical Exam  Constitutional: He is oriented to person, place, and time. He appears well-nourished.  Abdominal: Soft. There is tenderness.  RLQ still sl tender with deep palpation  Musculoskeletal: Normal range of motion.  Neurological: He is alert and oriented to person, place, and time.  Skin: Skin is warm and dry.  Site of drain is clean and dry NT no bleeding Output blood tinged serous color 45 cc  yesterday 25 cc in JP Cx: strept viridans and Ecoli   Psychiatric: He has a normal mood and affect. His behavior is normal.  Nursing note and vitals reviewed.   Imaging: Ct Abdomen Pelvis W Contrast  Result Date: 09/25/2016 CLINICAL DATA:  Left lower quadrant abdominal pain for 12 days. Night sweats. EXAM: CT ABDOMEN AND PELVIS WITH CONTRAST TECHNIQUE: Multidetector CT imaging of the abdomen and pelvis was performed using the standard protocol following bolus administration of intravenous contrast. CONTRAST:  100mL ISOVUE-300 IOPAMIDOL (ISOVUE-300) INJECTION 61% COMPARISON:  Multiple exams, including 07/19/2013 CT scan FINDINGS: Lower chest: Unremarkable Hepatobiliary: Mild diffuse hepatic steatosis is suspected. Otherwise unremarkable. Pancreas: Unremarkable Spleen: Unremarkable Adrenals/Urinary Tract: Adrenal glands normal. 9 mm fluid density cyst of the right kidney upper pole. Left lower renal pelvis 1.6 by 0.8 by 0.9 cm calculus along with several smaller caliceal calculi in the left kidney lower pole. Subtle wall thickening along the left renal pelvis inferiorly is probably secondary to inflammation from the adjacent stone. Urinary bladder unremarkable. Stomach/Bowel: There is an abscess spanning from the wall of the distal sigmoid colon into the adjacent paracolic adipose tissues, with several small internal loculations of localized gas, total size of this process approximately 3.0 by 4.3 by 3.5 cm. This is likely a diverticular abscess, less likely to be an ulceration of the wall of the colon with paracolic abscess. The more cephalad  portions did not have a sharply defined enhancing wall both the portion of the abscess in the wall of the colon is more clearly defined. There is surrounding inflammatory stranding. Several other sigmoid colon diverticula are present. Vascular/Lymphatic: Unremarkable Reproductive: Unremarkable Other: No supplemental non-categorized findings. 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 Moderate left and mild right foraminal stenosis at this level results. Small umbilical hernia contains adipose tissue. IMPRESSION: 1. An abscess spans from the wall of the distal sigmoid colon into the adjacent paracolic fat, with several locules of locally contained extraluminal gas. This is likely a diverticular abscess in the setting of diverticulitis. Less likely to be an ulceration or ulcerative mass of the colon wall leading to colon wall and paracolic abscess. Total size of the abnormal collection of gas and fluid approximately 3.0 by 4.3 by 3.5 cm although a significant portion of this is within the wall of the sigmoid colon. 2. Mild diffuse hepatic steatosis. 3. Nonobstructive left kidney lower pole calculi including a larger 1.6 cm calculus along the lower renal pelvis. 4. 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 Electronically Signed   By: Gaylyn Rong M.D.   On: 09/25/2016 15:07   Ct Image Guided Drainage By Percutaneous Catheter  Result Date: 09/26/2016 INDICATION: Diverticulitis.  Left lower quadrant abscess. EXAM: CT GUIDED DRAINAGE OF A PELVIC ABSCESS MEDICATIONS: The patient is currently admitted to the hospital and receiving intravenous antibiotics. The antibiotics were administered within an appropriate time frame prior to the initiation of the procedure. ANESTHESIA/SEDATION: One hundred fifty mg IV Versed Three mcg IV Fentanyl Moderate Sedation Time:  18 The patient was continuously monitored during the procedure by the interventional radiology nurse under my direct supervision. COMPLICATIONS: None immediate. TECHNIQUE: Informed written consent was obtained from the patient after a thorough discussion of the procedural risks, benefits and alternatives. All questions were addressed. Maximal Sterile Barrier  Technique was utilized including caps, mask, sterile gowns, sterile gloves, sterile drape, hand hygiene and skin antiseptic. A timeout was performed prior to the initiation of the procedure. PROCEDURE: The lower abdomen was prepped with ChloraPrep in a sterile fashion, and a sterile drape was applied covering the operative field. A sterile gown and sterile gloves were used for the procedure. Local anesthesia was provided with 1% Lidocaine. Under CT guidance, an 18 gauge needle was advanced into the left pelvic abscess. It was removed over an Amplatz wire. A 10 French dilator followed by a 10 [place] drain were inserted. It was looped and string fixed in the abscess cavity. It was sewn to the skin. Frank pus was aspirated. FINDINGS: Imaging documents 75 French drain placement into a left pelvic abscess. IMPRESSION: Successful 10 French left pelvic abscess drain. Electronically Signed   By: Jolaine Click M.D.   On: 09/26/2016 15:29    Labs:  CBC:  Recent Labs  09/25/16 1842 09/26/16 0554 09/28/16 0556  WBC 9.5 9.5 9.5  HGB 14.2 13.7 13.2  HCT 40.7 40.0 37.7*  PLT 184 175 215    COAGS:  Recent Labs  09/26/16 0729  INR 1.18    BMP:  Recent Labs  09/25/16 1842 09/26/16 0554 09/28/16 0556  NA 137 137 137  K 4.1 3.9 3.8  CL 102 105 107  CO2 27 23 22   GLUCOSE 111* 106* 99  BUN 11 12 10   CALCIUM  9.4 8.4* 8.8*  CREATININE 0.96 0.88 0.85  GFRNONAA >60 >60 >60  GFRAA >60 >60 >60    LIVER FUNCTION TESTS: No results for input(s): BILITOT, AST, ALT, ALKPHOS, PROT, ALBUMIN in the last 8760 hours.  Assessment and Plan:  Diverticular abscess Drain intact Output slowing T max 100.4 Will follow and plan per Dr Elnoria Howard IR will follow in OP Drain Clinic----orders in place Flush once daily at home- 5cc sterile saline Calculate output daily  Electronically Signed: Zollie Ellery A, PA-C 09/28/2016, 2:02 PM   I spent a total of 15 Minutes at the the patient's bedside AND on the  patient's hospital floor or unit, greater than 50% of which was counseling/coordinating care for diverticular abscess drain

## 2016-09-28 NOTE — Progress Notes (Signed)
Subjective: Feeling better, but still with some lower abdominal pain.  Tmax was 100.4 at 5:30 AM.  Objective: Vital signs in last 24 hours: Temp:  [98.7 F (37.1 C)-100.9 F (38.3 C)] 98.8 F (37.1 C) (06/15 1412) Pulse Rate:  [69-84] 73 (06/15 1537) Resp:  [16-18] 18 (06/15 1412) BP: (112-140)/(68-76) 119/68 (06/15 1412) SpO2:  [96 %-99 %] 99 % (06/15 1537) Last BM Date: 09/26/16  Intake/Output from previous day: 06/14 0701 - 06/15 0700 In: 2293.3 [P.O.:360; I.V.:1623.3; IV Piggyback:300] Out: 45 [Drains:45] Intake/Output this shift: Total I/O In: 645 [P.O.:540; Other:5; IV Piggyback:100] Out: -   General appearance: alert and no distress GI: LLQ tenderness with deeper palpation  Lab Results:  Recent Labs  09/25/16 1842 09/26/16 0554 09/28/16 0556  WBC 9.5 9.5 9.5  HGB 14.2 13.7 13.2  HCT 40.7 40.0 37.7*  PLT 184 175 215   BMET  Recent Labs  09/25/16 1842 09/26/16 0554 09/28/16 0556  NA 137 137 137  K 4.1 3.9 3.8  CL 102 105 107  CO2 27 23 22   GLUCOSE 111* 106* 99  BUN 11 12 10   CREATININE 0.96 0.88 0.85  CALCIUM 9.4 8.4* 8.8*   LFT No results for input(s): PROT, ALBUMIN, AST, ALT, ALKPHOS, BILITOT, BILIDIR, IBILI in the last 72 hours. PT/INR  Recent Labs  09/26/16 0729  LABPROT 15.1  INR 1.18   Hepatitis Panel No results for input(s): HEPBSAG, HCVAB, HEPAIGM, HEPBIGM in the last 72 hours. C-Diff No results for input(s): CDIFFTOX in the last 72 hours. Fecal Lactopherrin No results for input(s): FECLLACTOFRN in the last 72 hours.  Studies/Results: No results found.  Medications:  Scheduled: . enoxaparin (LOVENOX) injection  40 mg Subcutaneous Q24H   Continuous: . sodium chloride 100 mL/hr at 09/28/16 0033  . ciprofloxacin Stopped (09/28/16 0717)  . metronidazole Stopped (09/28/16 1200)    Assessment/Plan: 1) Diverticular abscess. 2) ABM pain.   Surgery signed off and IR will arrange for outpatient follow up.  I offered to send  him home today versus in the AM.  He had concerns about the fever.  I think it is prudent to monitor until the AM to be sure.    Plan: 1) Cipro and Flagyl. 2) Advance to a regular diet. 3) IR to arrange for outpatient follow up. 4) Continue with Tylenol.  LOS: 3 days   Shishir Krantz D 09/28/2016, 5:02 PM

## 2016-09-29 NOTE — Discharge Summary (Signed)
Physician Discharge Summary  Patient ID: Shawn ForestJoshua M Gay MRN: [id number] DOB/AGE: 39/05/1975 39 y.o.  Admit date: 09/25/2016 Discharge date: 09/29/2016  Admission Diagnoses: Sigmoid diverticular abscess  Discharge Diagnoses: Sigmoid diverticular abscess Active Problems:   Abscess of sigmoid colon   Discharged Condition: good  Hospital Course: The patient was admitted on 09/25/2016 for worsening LLQ abdominal pain and a CT scan revealing a sigmoid colon abscess measuring 3.5 x 4.5 x 3.0 cm.  He was converted from oral Cipro and Flagyl to IV formulation.  Surgery was consulted and he was evaluated on the day of admission and subsequently IR was consulted and a percutaneous drain was placed the next day.  Pus was extracted and oddly he started to spike fevers.  His Tmax during the hospitalization was at 102, but over the following days his fever subsided.  He was successfully treated with Tylenol.  After the procedure his diet was advanced to a regular diet, but it seemed to exacerbate his LLQ pain and he was reduced to a clear liquid diet.  Advancement of his diet was gradual and he tolerated PO without any difficulty.  On the day of discharge he did experience some "rectal spasms", which was thought to be secondary to his resumption of a regular diet and/or LLQ inflammation/drainage tube.  The spasms were not severe.  His Tmax the evening before discharge was at 100.  There was normalization of his WBC during this hospitalization.  Surgery did not feel the need for any surgical intervention at this time.  He is scheduled to follow up with IR for drainage catheter management and he will resume oral Cipro and Flagyl.  The patient was instructed to flush the catheter with 5 ml of normal saline daily and to record the drainage daily.  In addition to the IR follow up, he will follow up with me in the office in 2 weeks.  Consults: general surgery and IR  Significant Diagnostic Studies: labs: CBC and  BMP  Treatments: IV hydration, antibiotics: Cipro and metronidazole, analgesia: Morphine and anticoagulation: Lovenox  Discharge Exam: Blood pressure 128/86, pulse 78, temperature 99 F (37.2 C), temperature source Oral, resp. rate 18, height 5\' 8"  (1.727 m), weight 97.5 kg (215 lb), SpO2 98 %. General appearance: alert and no distress Resp: clear to auscultation bilaterally Cardio: regular rate and rhythm GI: marked improved tenderness in the LLQ Extremities: extremities normal, atraumatic, no cyanosis or edema  Disposition: 01-Home or Self Care   Allergies as of 09/29/2016      Reactions   Gluten Meal Other (See Comments)   Gas /pain      Medication List    TAKE these medications   cholecalciferol 1000 units tablet Commonly known as:  VITAMIN D Take 1,000 Units by mouth daily.   docusate sodium 100 MG capsule Commonly known as:  COLACE Take 1 capsule (100 mg total) by mouth 2 (two) times daily.   levothyroxine 100 MCG tablet Commonly known as:  SYNTHROID, LEVOTHROID Take 137 mcg by mouth daily before breakfast.   liothyronine 5 MCG tablet Commonly known as:  CYTOMEL Take 5 mcg by mouth daily.   ondansetron 4 MG disintegrating tablet Commonly known as:  ZOFRAN ODT Take 1 tablet (4 mg total) by mouth every 8 (eight) hours as needed for nausea or vomiting.   oxyCODONE-acetaminophen 5-325 MG tablet Commonly known as:  ROXICET Take 1 tablet by mouth every 4 (four) hours as needed for severe pain.      Follow-up  Information    Hoss, Arthur, MD Follow up in 10 day(s).   Specialty:  Interventional Radiology Why:  follow up in IR drain clinic; pt will hear from scheduler for time and date; call [phone] if questions or concerns Contact information: [address] [place] [address] [phone]           Signed: Theda Belfast 09/29/2016, 8:20 AM

## 2016-09-29 NOTE — Progress Notes (Signed)
Pt for discharge going home change the dressing, removed the peripheral IV line, health teaching given, next due meds schedule, pain meds, no complain of pain, he took shower prior to discharge, going home with JP drain show how to flush and how to empty it, given all his personal belongings.

## 2016-09-29 NOTE — Progress Notes (Signed)
Pt discharged going home accompanied by Nurse tech via wheelchair, girlfriend at the bedside, teach how to empty the JP and flushed it.

## 2016-10-01 ENCOUNTER — Other Ambulatory Visit: Payer: Self-pay | Admitting: Gastroenterology

## 2016-10-01 DIAGNOSIS — K630 Abscess of intestine: Secondary | ICD-10-CM

## 2016-10-02 LAB — AEROBIC/ANAEROBIC CULTURE (SURGICAL/DEEP WOUND)

## 2016-10-02 LAB — AEROBIC/ANAEROBIC CULTURE W GRAM STAIN (SURGICAL/DEEP WOUND)

## 2016-10-04 ENCOUNTER — Ambulatory Visit
Admission: RE | Admit: 2016-10-04 | Discharge: 2016-10-04 | Disposition: A | Discharge status: Home / Self Care | Payer: 59 | Source: Ambulatory Visit | Attending: Gastroenterology | Admitting: Gastroenterology

## 2016-10-04 ENCOUNTER — Other Ambulatory Visit: Payer: Self-pay | Admitting: Radiology

## 2016-10-04 ENCOUNTER — Ambulatory Visit
Admission: RE | Admit: 2016-10-04 | Discharge: 2016-10-04 | Disposition: A | Discharge status: Home / Self Care | Payer: 59 | Source: Ambulatory Visit | Attending: Radiology | Admitting: Radiology

## 2016-10-04 DIAGNOSIS — K630 Abscess of intestine: Secondary | ICD-10-CM

## 2016-10-04 DIAGNOSIS — K5720 Diverticulitis of large intestine with perforation and abscess without bleeding: Secondary | ICD-10-CM

## 2016-10-04 MED ORDER — IOPAMIDOL (ISOVUE-300) INJECTION 61%
125.0000 mL | Freq: Once | INTRAVENOUS | Status: AC | PRN
  Administered 2016-10-04: 125 mL via INTRAVENOUS

## 2016-10-04 MED ORDER — IOPAMIDOL (ISOVUE-300) INJECTION 61%: 100.0000 mL | Freq: Once | INTRAVENOUS | Status: DC | PRN

## 2016-10-04 NOTE — Progress Notes (Signed)
Patient ID: Shawn Gay, male   DOB: 1975/09/13, 41 y.o.   MRN: [id number]       Chief Complaint:  Sigmoid diverticular abscess, status post percutaneous drain, outpatient follow-up.  Referring Physician(s): Turpin,Pamela  History of Present Illness: Shawn Gay is a 41 y.o. male with acute perforated sigmoid diverticulitis, status post percutaneous drain of an anterior pelvic diverticular abscess 09/26/2016. Patient has recovered as an outpatient. He is on oral antibiotics. No interval fevers. Resolved abdominal pain. He reports minimal output from the percutaneous drain. He has been flushing the catheter 3 times a day. Drain is connected to a suction bulb.  Outpatient CT performed today. This confirms resolution of the anterior pelvic abscess of the drain catheter site however there is a new right posterior pelvic abscess which has developed.  Past Medical History:  Diagnosis Date  . Colonic diverticular abscess 09/25/2016  . GERD (gastroesophageal reflux disease)   . Gluten intolerance   . History of kidney stones   . Hypothyroid   . UPJ obstruction, congenital     Past Surgical History:  Procedure Laterality Date  . CYSTOSCOPY WITH URETEROSCOPY, STONE BASKETRY AND STENT PLACEMENT     "I've had at least 3 stents"  . ENDOPYELOTOMY     [name] 07/19/2013  . 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 LITHOTRIPSY  2003 or 2004 X 2  . NEPHROLITHOTOMY Left 07/23/2013   Procedure: LEFT NEPHROLITHOTOMY PERCUTANEOUS;  Surgeon: Bjorn Pippin, MD;  Location: [facility];  Service: Urology;  Laterality: Left;  . URETEROSCOPY  2003 or 2003   x 1    Allergies: Gluten meal  Medications: Prior to Admission medications   Medication Sig Start Date End Date Taking? Authorizing Provider  cholecalciferol (VITAMIN D) 1000 UNITS tablet Take 1,000 Units by mouth daily.    [provider]  docusate sodium (COLACE) 100 MG capsule Take 1 capsule (100 mg total) by mouth 2 (two) times daily.  07/24/13   Bjorn Pippin, MD  levothyroxine (SYNTHROID, LEVOTHROID) 100 MCG tablet Take 137 mcg by mouth daily before breakfast.     [provider]  liothyronine (CYTOMEL) 5 MCG tablet Take 5 mcg by mouth daily.    [provider]  ondansetron (ZOFRAN ODT) 4 MG disintegrating tablet Take 1 tablet (4 mg total) by mouth every 8 (eight) hours as needed for nausea or vomiting. 07/20/13   Bjorn Pippin, MD  oxyCODONE-acetaminophen (ROXICET) 5-325 MG per tablet Take 1 tablet by mouth every 4 (four) hours as needed for severe pain. Patient not taking: Reported on 09/26/2016 07/20/13   Bjorn Pippin, MD     Family History  Problem Relation Age of Onset  . Diabetes Paternal Grandfather   . Cancer Paternal Grandfather     Social History   Social History  . Marital status: Divorced    Spouse name: N/A  . Number of children: N/A  . Years of education: N/A   Occupational History  . student [employer]   Social History Main Topics  . Smoking status: Former Smoker    Types: Cigarettes  . Smokeless tobacco: Never Used     Comment: social only in high school  . Alcohol use 1.8 oz/week    3 Cans of beer per week  . Drug use: No  . Sexual activity: Yes   Other Topics Concern  . Not on file   Social History Narrative  . No narrative on file      Review of Systems:  A 12 point ROS discussed and pertinent positives are indicated in the HPI above.  All other systems are negative.  Review of Systems  Vital Signs: BP 124/72   Pulse 64   Temp 98.2 F (36.8 C) (Oral)   SpO2 98%   Physical Exam  Constitutional: He appears well-developed and well-nourished. No distress.  Abdominal: Soft. Bowel sounds are normal. He exhibits no distension. There is no tenderness.  Anterior pelvic drain catheter site is clean, dry and intact. Minimal exudative output in the collection bulb.  Skin: He is not diaphoretic.     Imaging: Ct Abdomen Pelvis W Contrast  Result Date: 10/04/2016 CLINICAL DATA:   Sigmoid diverticulitis, status post percutaneous drain of abscess EXAM: CT ABDOMEN AND PELVIS WITH CONTRAST TECHNIQUE: Multidetector CT imaging of the abdomen and pelvis was performed using the standard protocol following bolus administration of intravenous contrast. CONTRAST:  ISOVUE-300 IOPAMIDOL (ISOVUE-300) INJECTION 61% COMPARISON:  09/26/2016, 09/25/2016 FINDINGS: Lower chest: No acute abnormality. Hepatobiliary: No focal liver abnormality is seen. No gallstones, gallbladder wall thickening, or biliary dilatation. Pancreas: Unremarkable. No pancreatic ductal dilatation or surrounding inflammatory changes. Spleen: Normal in size without focal abnormality. Accessory splenule noted. Adrenals/Urinary Tract: Normal adrenal glands. Right kidney and ureter demonstrate no significant or acute finding. No obstruction pattern. Left kidney demonstrates no acute obstruction or hydronephrosis. Stable left lower pole nonobstructing calculi, largest measures 16 mm, image 39. No obstructing left ureteral calculus. Urinary bladder is collapsed. Stomach/Bowel: Negative for bowel obstruction, significant dilatation, ileus, or free air. Normal retrocecal appendix demonstrated. Anterior pelvic abscess drain is stable in position. At the drain catheter site, the diverticular abscess has resolved. There is improvement in the surrounding inflammation/ edema about the sigmoid colon. However, there is a new posterior right hemipelvis fluid collection with peripheral enhancement measuring 4.6 x 3.4 cm, image 75. This is concerning for a new pelvic abscess and would be amenable to CT drainage from a right trans gluteal approach. Vascular/Lymphatic: No significant vascular findings are present. No enlarged abdominal or pelvic lymph nodes. Reproductive: No significant or acute finding Other: No inguinal or abdominal wall hernia Musculoskeletal: No acute osseous finding. 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 IMPRESSION: Resolved anterior pelvic abscess following percutaneous drain. Drain catheter is stable in position. Improving anterior pelvic sigmoid diverticulitis. Interval development of a posterior right pelvic peripherally enhancing 4.6 x 3.4 cm fluid collection concerning for a a second pelvic abscess which would be amenable to percutaneous aspiration/drainage. 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 Electronically Signed   By: Judie Petit.  Moira Umholtz M.D.   On: 10/04/2016 13:01   Ct Abdomen Pelvis W Contrast  Result Date: 09/25/2016 CLINICAL DATA:  Left lower quadrant abdominal pain for 12 days. Night sweats. EXAM: CT ABDOMEN AND PELVIS WITH CONTRAST TECHNIQUE: Multidetector CT imaging of the abdomen and pelvis was performed using the standard protocol following bolus administration of intravenous contrast. CONTRAST:  ISOVUE-300 IOPAMIDOL (ISOVUE-300) INJECTION 61% COMPARISON:  Multiple exams, including 07/19/2013 CT scan FINDINGS: Lower chest: Unremarkable Hepatobiliary: Mild diffuse hepatic steatosis is suspected. Otherwise unremarkable. Pancreas: Unremarkable Spleen: Unremarkable Adrenals/Urinary Tract: Adrenal glands normal. 9 mm fluid density cyst of the right kidney upper pole. Left lower renal pelvis 1.6 by 0.8 by 0.9 cm calculus along with several smaller caliceal calculi in the left kidney lower pole. Subtle wall thickening along the left renal pelvis inferiorly is probably secondary to inflammation from the  adjacent stone. Urinary bladder unremarkable. Stomach/Bowel: There is an abscess spanning from the wall of the distal sigmoid colon into the adjacent paracolic adipose tissues, with several small internal loculations of localized gas, total size of this process approximately 3.0 by 4.3 by 3.5 cm. This is likely a diverticular abscess, less likely to be an ulceration of the wall of  the colon with paracolic abscess. The more cephalad portions did not have a sharply defined enhancing wall both the portion of the abscess in the wall of the colon is more clearly defined. There is surrounding inflammatory stranding. Several other sigmoid colon diverticula are present. Vascular/Lymphatic: Unremarkable Reproductive: Unremarkable Other: No supplemental non-categorized findings. Musculoskeletal: 6 mm grade 1 anterolisthesis of L5 on S1 associated with chronic L5 pars defects. Moderate left and mild right foraminal stenosis at this level results. Small umbilical hernia contains adipose tissue. IMPRESSION: 1. An abscess spans from the wall of the distal sigmoid colon into the adjacent paracolic fat, with several locules of locally contained extraluminal gas. This is likely a diverticular abscess in the setting of diverticulitis. Less likely to be an ulceration or ulcerative mass of the colon wall leading to colon wall and paracolic abscess. Total size of the abnormal collection of gas and fluid approximately 3.0 by 4.3 by 3.5 cm although a significant portion of this is within the wall of the sigmoid colon. 2. Mild diffuse hepatic steatosis. 3. Nonobstructive left kidney lower pole calculi including a larger 1.6 cm calculus along the lower renal pelvis. 4. 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 Electronically Signed   By: Gaylyn RongWalter  Liebkemann M.D.   On: 09/25/2016 15:07   Dg Sinus/fist Tube Chk-non Gi  Result Date: 10/04/2016 CLINICAL DATA:  Sigmoid diverticular abscess, status post percutaneous drainage 09/26/2016 EXAM: ABSCESS INJECTION CONTRAST:  10 cc Omnipaque 300 FLUOROSCOPY TIME:  Fluoroscopy Time:  60 second Radiation Exposure Index (if provided by the fluoroscopic device): 90 mGy Number of Acquired Spot Images: 7 COMPARISON:  10/05/2006 FINDINGS: Injection performed of the anterior pelvic  diverticular abscess drain. Irregular collapsed abscess cavity at the drain catheter site. Cavity is collapsed. Contrast tracks along adjacent pericolonic fat planes but no definite fistula to the adjacent sigmoid. Syringe aspiration completely collapses the cavity. IMPRESSION: Stable drain catheter position. Abscess cavity is collapsed without definite fistula to adjacent sigmoid. Electronically Signed   By: Judie PetitM.  Ardell Makarewicz M.D.   On: 10/04/2016 13:47   Ct Image Guided Drainage By Percutaneous Catheter  Result Date: 09/26/2016 INDICATION: Diverticulitis.  Left lower quadrant abscess. EXAM: CT GUIDED DRAINAGE OF A PELVIC ABSCESS MEDICATIONS: The patient is currently admitted to the hospital and receiving intravenous antibiotics. The antibiotics were administered within an appropriate time frame prior to the initiation of the procedure. ANESTHESIA/SEDATION: One hundred fifty mg IV Versed Three mcg IV Fentanyl Moderate Sedation Time:  18 The patient was continuously monitored during the procedure by the interventional radiology nurse under my direct supervision. COMPLICATIONS: None immediate. TECHNIQUE: Informed written consent was obtained from the patient after a thorough discussion of the procedural risks, benefits and alternatives. All questions were addressed. Maximal Sterile Barrier Technique was utilized including caps, mask, sterile gowns, sterile gloves, sterile drape, hand hygiene and skin antiseptic. A timeout was performed prior to the initiation of the procedure. PROCEDURE: The lower abdomen was prepped with ChloraPrep in a sterile fashion, and a sterile drape was applied covering the operative field.  A sterile gown and sterile gloves were used for the procedure. Local anesthesia was provided with 1% Lidocaine. Under CT guidance, an 18 gauge needle was advanced into the left pelvic abscess. It was removed over an Amplatz wire. A 10 French dilator followed by a 10 [place] drain were inserted. It was looped  and string fixed in the abscess cavity. It was sewn to the skin. Frank pus was aspirated. FINDINGS: Imaging documents 47 French drain placement into a left pelvic abscess. IMPRESSION: Successful 10 French left pelvic abscess drain. Electronically Signed   By: Jolaine Click M.D.   On: 09/26/2016 15:29    Labs:  CBC:  Recent Labs  09/25/16 1842 09/26/16 0554 09/28/16 0556  WBC 9.5 9.5 9.5  HGB 14.2 13.7 13.2  HCT 40.7 40.0 37.7*  PLT 184 175 215    COAGS:  Recent Labs  09/26/16 0729  INR 1.18    BMP:  Recent Labs  09/25/16 1842 09/26/16 0554 09/28/16 0556  NA 137 137 137  K 4.1 3.9 3.8  CL 102 105 107  CO2 27 23 22   GLUCOSE 111* 106* 99  BUN 11 12 10   CALCIUM 9.4 8.4* 8.8*  CREATININE 0.96 0.88 0.85  GFRNONAA >60 >60 >60  GFRAA >60 >60 >60    LIVER FUNCTION TESTS: No results for input(s): BILITOT, AST, ALT, ALKPHOS, PROT, ALBUMIN in the last 8760 hours.    Assessment and Plan:  Nine-days status post anterior pelvic diverticular abscess drain placement. CT today confirms resolution of the anterior pelvic abscess. Drain injection demonstrates collapse of the abscess cavity without evidence of significant fistula to the sigmoid colon.  However, there is a new right posterior pelvic fluid collection by CT. This is concerning for development of a second abscess. This collection is amenable to CT-guided aspiration/drainage from a right transgluteal approach.  Plan: Switch existing anterior pelvic drain to gravity drainage. Continue daily flushing.  Schedule for CT-guided percutaneous aspiration/drainage of the new posterior right pelvic abscess tomorrow (10/05/2016) at [facility].     Electronically Signed: Berdine Dance 10/04/2016, 2:03 PM   I spent a total of  30 Minutes   in face to face in clinical consultation, greater than 50% of which was counseling/coordinating care for diverticular abscess drain

## 2016-10-05 ENCOUNTER — Ambulatory Visit (HOSPITAL_COMMUNITY)
Admission: RE | Admit: 2016-10-05 | Discharge: 2016-10-05 | Disposition: A | Discharge status: Home / Self Care | Payer: 59 | Source: Ambulatory Visit | Attending: Interventional Radiology | Admitting: Interventional Radiology

## 2016-10-05 ENCOUNTER — Encounter (HOSPITAL_COMMUNITY): Payer: Self-pay

## 2016-10-05 VITALS — BP 124/75 | HR 57 | Temp 98.9°F | Resp 12 | Ht 68.0 in | Wt 215.0 lb

## 2016-10-05 DIAGNOSIS — Z79899 Other long term (current) drug therapy: Secondary | ICD-10-CM | POA: Diagnosis not present

## 2016-10-05 DIAGNOSIS — Z87442 Personal history of urinary calculi: Secondary | ICD-10-CM | POA: Insufficient documentation

## 2016-10-05 DIAGNOSIS — Z87891 Personal history of nicotine dependence: Secondary | ICD-10-CM | POA: Diagnosis not present

## 2016-10-05 DIAGNOSIS — E039 Hypothyroidism, unspecified: Secondary | ICD-10-CM | POA: Diagnosis not present

## 2016-10-05 DIAGNOSIS — K630 Abscess of intestine: Secondary | ICD-10-CM

## 2016-10-05 DIAGNOSIS — K219 Gastro-esophageal reflux disease without esophagitis: Secondary | ICD-10-CM | POA: Insufficient documentation

## 2016-10-05 DIAGNOSIS — K5720 Diverticulitis of large intestine with perforation and abscess without bleeding: Secondary | ICD-10-CM | POA: Insufficient documentation

## 2016-10-05 LAB — APTT: aPTT: 32 seconds (ref 24–36)

## 2016-10-05 LAB — CBC
HCT: 40 % (ref 39.0–52.0)
Hemoglobin: 14 g/dL (ref 13.0–17.0)
MCH: 30.2 pg (ref 26.0–34.0)
MCHC: 35 g/dL (ref 30.0–36.0)
MCV: 86.4 fL (ref 78.0–100.0)
PLATELETS: 305 10*3/uL (ref 150–400)
RBC: 4.63 MIL/uL (ref 4.22–5.81)
RDW: 12.4 % (ref 11.5–15.5)
WBC: 5.3 10*3/uL (ref 4.0–10.5)

## 2016-10-05 LAB — PROTIME-INR
INR: 1.11
Prothrombin Time: 14.3 seconds (ref 11.4–15.2)

## 2016-10-05 MED ORDER — FENTANYL CITRATE (PF) 100 MCG/2ML IJ SOLN
INTRAMUSCULAR | Status: AC | PRN
  Administered 2016-10-05 (×2): 50 ug via INTRAVENOUS

## 2016-10-05 MED ORDER — LIDOCAINE HCL (PF) 1 % IJ SOLN
INTRAMUSCULAR | Status: AC
  Filled 2016-10-05: qty 30

## 2016-10-05 MED ORDER — MIDAZOLAM HCL 2 MG/2ML IJ SOLN
INTRAMUSCULAR | Status: AC
Start: 2016-10-05 — End: 2016-10-05
  Filled 2016-10-05: qty 4

## 2016-10-05 MED ORDER — FENTANYL CITRATE (PF) 100 MCG/2ML IJ SOLN
INTRAMUSCULAR | Status: AC
Start: 2016-10-05 — End: 2016-10-05
  Filled 2016-10-05: qty 4

## 2016-10-05 MED ORDER — LIDOCAINE HCL (CARDIAC) 20 MG/ML IV SOLN
INTRAVENOUS | Status: AC
  Filled 2016-10-05: qty 5

## 2016-10-05 MED ORDER — SODIUM CHLORIDE 0.9 % IV SOLN
INTRAVENOUS | Status: AC | PRN
  Administered 2016-10-05: 75 mL/h via INTRAVENOUS

## 2016-10-05 MED ORDER — MIDAZOLAM HCL 2 MG/2ML IJ SOLN
INTRAMUSCULAR | Status: AC | PRN
  Administered 2016-10-05 (×2): 1 mg via INTRAVENOUS
  Administered 2016-10-05: 2 mg via INTRAVENOUS

## 2016-10-05 MED ORDER — SODIUM CHLORIDE 0.9 % IV SOLN: INTRAVENOUS | Status: DC

## 2016-10-05 MED ORDER — LIDOCAINE-EPINEPHRINE 1 %-1:100000 IJ SOLN
INTRAMUSCULAR | Status: AC
  Filled 2016-10-05: qty 1

## 2016-10-05 MED ORDER — ONDANSETRON HCL 4 MG/2ML IJ SOLN
INTRAMUSCULAR | Status: AC
  Filled 2016-10-05: qty 2

## 2016-10-05 MED ORDER — ONDANSETRON HCL 4 MG/2ML IJ SOLN
4.0000 mg | Freq: Once | INTRAMUSCULAR | Status: AC
  Administered 2016-10-05: 4 mg via INTRAVENOUS

## 2016-10-05 NOTE — Sedation Documentation (Signed)
Patient is resting comfortably. 

## 2016-10-05 NOTE — Sedation Documentation (Signed)
O2 d/c'd 

## 2016-10-05 NOTE — Discharge Instructions (Signed)
Moderate Conscious Sedation, Adult, Care After °These instructions provide you with information about caring for yourself after your procedure. Your health care provider may also give you more specific instructions. Your treatment has been planned according to current medical practices, but problems sometimes occur. Call your health care provider if you have any problems or questions after your procedure. °What can I expect after the procedure? °After your procedure, it is common: °· To feel sleepy for several hours. °· To feel clumsy and have poor balance for several hours. °· To have poor judgment for several hours. °· To vomit if you eat too soon. ° °Follow these instructions at home: °For at least 24 hours after the procedure: ° °· Do not: °? Participate in activities where you could fall or become injured. °? Drive. °? Use heavy machinery. °? Drink alcohol. °? Take sleeping pills or medicines that cause drowsiness. °? Make important decisions or sign legal documents. °? Take care of children on your own. °· Rest. °Eating and drinking °· Follow the diet recommended by your health care provider. °· If you vomit: °? Drink water, juice, or soup when you can drink without vomiting. °? Make sure you have little or no nausea before eating solid foods. °General instructions °· Have a responsible adult stay with you until you are awake and alert. °· Take over-the-counter and prescription medicines only as told by your health care provider. °· If you smoke, do not smoke without supervision. °· Keep all follow-up visits as told by your health care provider. This is important. °Contact a health care provider if: °· You keep feeling nauseous or you keep vomiting. °· You feel light-headed. °· You develop a rash. °· You have a fever. °Get help right away if: °· You have trouble breathing. °This information is not intended to replace advice given to you by your health care provider. Make sure you discuss any questions you have  with your health care provider. °Document Released: 01/21/2013 Document Revised: 09/05/2015 Document Reviewed: 07/23/2015 °Elsevier Interactive Patient Education © 2018 [employer] °Percutaneous Abscess Drain, Care After °This sheet gives you information about how to care for yourself after your procedure. Your health care provider may also give you more specific instructions. If you have problems or questions, contact your health care provider. °What can I expect after the procedure? °After your procedure, it is common to have: °· A small amount of bruising and discomfort in the area where the drainage tube (catheter) was placed. °· Sleepiness and fatigue. This should go away after the medicines you were given have worn off. ° °Follow these instructions at home: °Incision care °· Follow instructions from your health care provider about how to take care of your incision. Make sure you: °? Wash your hands with soap and water before you change your bandage (dressing). If soap and water are not available, use hand sanitizer. °? Change your dressing as told by your health care provider. °? Leave stitches (sutures), skin glue, or adhesive strips in place. These skin closures may need to stay in place for 2 weeks or longer. If adhesive strip edges start to loosen and curl up, you may trim the loose edges. Do not remove adhesive strips completely unless your health care provider tells you to do that. °· Check your incision area every day for signs of infection. Check for: °? More redness, swelling, or pain. °? More fluid or blood. °? Warmth. °? Pus or a bad smell. °? Fluid leaking from around   your catheter (instead of fluid draining through your catheter). °Catheter care °· Follow instructions from your health care provider about emptying and cleaning your catheter and collection bag. You may need to clean the catheter every day so it does not clog. °· If directed, write down the following information every time you  empty your bag: °? The date and time. °? The amount of drainage. °General instructions °· Rest at home for 1-2 days after your procedure. Return to your normal activities as told by your health care provider. °· Do not take baths, swim, or use a hot tub for 24 hours after your procedure, or until your health care provider says that this is okay. °· Take over-the-counter and prescription medicines only as told by your health care provider. °· Keep all follow-up visits as told by your health care provider. This is important. °Contact a health care provider if: °· You have less than 10 mL of drainage a day for 2-3 days in a row, or as directed by your health care provider. °· You have more redness, swelling, or pain around your incision area. °· You have more fluid or blood coming from your incision area. °· Your incision area feels warm to the touch. °· You have pus or a bad smell coming from your incision area. °· You have fluid leaking from around your catheter (instead of through your catheter). °· You have a fever or chills. °· You have pain that does not get better with medicine. °Get help right away if: °· Your catheter comes out. °· You suddenly stop having drainage from your catheter. °· You suddenly have blood in the fluid that is draining from your catheter. °· You become dizzy or you faint. °· You develop a rash. °· You have nausea or vomiting. °· You have difficulty breathing or you feel short of breath. °· You develop chest pain. °· You have problems with your speech or vision. °· You have trouble balancing or moving your arms or legs. °Summary °· It is common to have a small amount of bruising and discomfort in the area where the drainage tube (catheter) was placed. °· You may be directed to record the amount of drainage from the bag every time you empty it. °· Follow instructions from your health care provider about emptying and cleaning your catheter and collection bag. °This information is not  intended to replace advice given to you by your health care provider. Make sure you discuss any questions you have with your health care provider. °Document Released: 08/17/2013 Document Revised: 02/23/2016 Document Reviewed: 02/23/2016 °Elsevier Interactive Patient Education © 2017 [employer] ° °

## 2016-10-05 NOTE — H&P (Signed)
Chief Complaint: Patient was seen in consultation today for diverticular abscess aspiration/drain placement at the request of Dr Chip Boer  Referring Physician(s): Dr Chip Boer  Supervising Physician: Ruel Favors  Patient Status: [name] - Out-pt  History of Present Illness: Shawn Gay is a 41 y.o. male   Sigmoid colon diverticular abscess Existing anterior drain placed 09/26/16 Was seen in IR OP [facility] 6/21--- Imaging did reveal resolution of abscess Unfortunately new abscess noted CT: IMPRESSION: Resolved anterior pelvic abscess following percutaneous drain. Drain catheter is stable in position. Improving anterior pelvic sigmoid diverticulitis. Interval development of a posterior right pelvic peripherally enhancing 4.6 x 3.4 cm fluid collection concerning for a a second pelvic abscess which would be amenable to percutaneous aspiration/drainage. Drain injection: IMPRESSION: Stable drain catheter position. Abscess cavity is collapsed without definite fistula to adjacent sigmoid.  Now scheduled for aspiration/drain placement of new abscess  Past Medical History:  Diagnosis Date  . Colonic diverticular abscess 09/25/2016  . GERD (gastroesophageal reflux disease)   . Gluten intolerance   . History of kidney stones   . Hypothyroid   . UPJ obstruction, congenital     Past Surgical History:  Procedure Laterality Date  . CYSTOSCOPY WITH URETEROSCOPY, STONE BASKETRY AND STENT PLACEMENT     "I've had at least 3 stents"  . ENDOPYELOTOMY     [name] 07/19/2013  . 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 LITHOTRIPSY  2003 or 2004 X 2  . NEPHROLITHOTOMY Left 07/23/2013   Procedure: LEFT NEPHROLITHOTOMY PERCUTANEOUS;  Surgeon: Bjorn Pippin, MD;  Location: [facility] ORS;  Service: Urology;  Laterality: Left;  . URETEROSCOPY  2003 or 2003   x 1    Allergies: Gluten meal  Medications: Prior to Admission medications   Medication Sig Start Date End Date Taking? Authorizing  Provider  cholecalciferol (VITAMIN D) 1000 UNITS tablet Take 1,000 Units by mouth daily.   Yes [provider]  levothyroxine (SYNTHROID, LEVOTHROID) 100 MCG tablet Take 137 mcg by mouth daily before breakfast.    Yes [provider]  liothyronine (CYTOMEL) 5 MCG tablet Take 5 mcg by mouth daily.   Yes [provider]  docusate sodium (COLACE) 100 MG capsule Take 1 capsule (100 mg total) by mouth 2 (two) times daily. 07/24/13   Bjorn Pippin, MD  ondansetron (ZOFRAN ODT) 4 MG disintegrating tablet Take 1 tablet (4 mg total) by mouth every 8 (eight) hours as needed for nausea or vomiting. 07/20/13   Bjorn Pippin, MD  oxyCODONE-acetaminophen (ROXICET) 5-325 MG per tablet Take 1 tablet by mouth every 4 (four) hours as needed for severe pain. Patient not taking: Reported on 09/26/2016 07/20/13   Bjorn Pippin, MD     Family History  Problem Relation Age of Onset  . Diabetes Paternal Grandfather   . Cancer Paternal Grandfather     Social History   Social History  . Marital status: Divorced    Spouse name: N/A  . Number of children: N/A  . Years of education: N/A   Occupational History  . student [employer]   Social History Main Topics  . Smoking status: Former Smoker    Types: Cigarettes  . Smokeless tobacco: Never Used     Comment: social only in high school  . Alcohol use 1.8 oz/week    3 Cans of beer per week  . Drug use: No  . Sexual activity: Yes   Other Topics Concern  . None   Social History  Narrative  . None    Review of Systems: A 12 point ROS discussed and pertinent positives are indicated in the HPI above.  All other systems are negative.  Review of Systems  Constitutional: Negative for activity change, fatigue and fever.  Gastrointestinal: Negative for abdominal pain, blood in stool and constipation.  Musculoskeletal: Negative for gait problem.  Neurological: Negative for weakness.  Psychiatric/Behavioral: Negative for behavioral problems and  confusion.    Vital Signs: BP 123/82   Pulse 70   Temp 98.9 F (37.2 C)   Resp 20   Ht 5\' 8"  (1.727 m)   Wt 215 lb (97.5 kg)   SpO2 97%   BMI 32.69 kg/m   Physical Exam  Constitutional: He is oriented to person, place, and time.  Cardiovascular: Normal rate and regular rhythm.   Pulmonary/Chest: Effort normal and breath sounds normal.  Abdominal: Soft. Bowel sounds are normal. There is no tenderness.  Musculoskeletal: Normal range of motion.  Neurological: He is alert and oriented to person, place, and time.  Skin: Skin is warm and dry.  Psychiatric: He has a normal mood and affect. His behavior is normal. Judgment and thought content normal.  Nursing note and vitals reviewed.   Mallampati Score:  MD Evaluation Airway: WNL Heart: WNL Abdomen: WNL Chest/ Lungs: WNL ASA  Classification: 2 Mallampati/Airway Score: One  Imaging: Ct Abdomen Pelvis W Contrast  Result Date: 10/04/2016 CLINICAL DATA:  Sigmoid diverticulitis, status post percutaneous drain of abscess EXAM: CT ABDOMEN AND PELVIS WITH CONTRAST TECHNIQUE: Multidetector CT imaging of the abdomen and pelvis was performed using the standard protocol following bolus administration of intravenous contrast. CONTRAST:  125mL ISOVUE-300 IOPAMIDOL (ISOVUE-300) INJECTION 61% COMPARISON:  09/26/2016, 09/25/2016 FINDINGS: Lower chest: No acute abnormality. Hepatobiliary: No focal liver abnormality is seen. No gallstones, gallbladder wall thickening, or biliary dilatation. Pancreas: Unremarkable. No pancreatic ductal dilatation or surrounding inflammatory changes. Spleen: Normal in size without focal abnormality. Accessory splenule noted. Adrenals/Urinary Tract: Normal adrenal glands. Right kidney and ureter demonstrate no significant or acute finding. No obstruction pattern. Left kidney demonstrates no acute obstruction or hydronephrosis. Stable left lower pole nonobstructing calculi, largest measures 16 mm, image 39. No obstructing  left ureteral calculus. Urinary bladder is collapsed. Stomach/Bowel: Negative for bowel obstruction, significant dilatation, ileus, or free air. Normal retrocecal appendix demonstrated. Anterior pelvic abscess drain is stable in position. At the drain catheter site, the diverticular abscess has resolved. There is improvement in the surrounding inflammation/ edema about the sigmoid colon. However, there is a new posterior right hemipelvis fluid collection with peripheral enhancement measuring 4.6 x 3.4 cm, image 75. This is concerning for a new pelvic abscess and would be amenable to CT drainage from a right trans gluteal approach. Vascular/Lymphatic: No significant vascular findings are present. No enlarged abdominal or pelvic lymph nodes. Reproductive: No significant or acute finding Other: No inguinal or abdominal wall hernia Musculoskeletal: No acute osseous finding. 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 IMPRESSION: Resolved anterior pelvic abscess following percutaneous drain. Drain catheter is stable in position. Improving anterior pelvic sigmoid diverticulitis. Interval development of a posterior right pelvic peripherally enhancing 4.6 x 3.4 cm fluid collection concerning for a a second pelvic abscess which would be amenable to percutaneous aspiration/drainage. 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 Electronically Signed   By: Judie PetitM.  Shick M.D.   On: 10/04/2016 13:01   Ct Abdomen Pelvis W Contrast  Result  Date: 09/25/2016 CLINICAL DATA:  Left lower quadrant abdominal pain for 12 days. Night sweats. EXAM: CT ABDOMEN AND PELVIS WITH CONTRAST TECHNIQUE: Multidetector CT imaging of the abdomen and pelvis was performed using the standard protocol following bolus administration of intravenous contrast. CONTRAST:  ISOVUE-300 IOPAMIDOL (ISOVUE-300) INJECTION  61% COMPARISON:  Multiple exams, including 07/19/2013 CT scan FINDINGS: Lower chest: Unremarkable Hepatobiliary: Mild diffuse hepatic steatosis is suspected. Otherwise unremarkable. Pancreas: Unremarkable Spleen: Unremarkable Adrenals/Urinary Tract: Adrenal glands normal. 9 mm fluid density cyst of the right kidney upper pole. Left lower renal pelvis 1.6 by 0.8 by 0.9 cm calculus along with several smaller caliceal calculi in the left kidney lower pole. Subtle wall thickening along the left renal pelvis inferiorly is probably secondary to inflammation from the adjacent stone. Urinary bladder unremarkable. Stomach/Bowel: There is an abscess spanning from the wall of the distal sigmoid colon into the adjacent paracolic adipose tissues, with several small internal loculations of localized gas, total size of this process approximately 3.0 by 4.3 by 3.5 cm. This is likely a diverticular abscess, less likely to be an ulceration of the wall of the colon with paracolic abscess. The more cephalad portions did not have a sharply defined enhancing wall both the portion of the abscess in the wall of the colon is more clearly defined. There is surrounding inflammatory stranding. Several other sigmoid colon diverticula are present. Vascular/Lymphatic: Unremarkable Reproductive: Unremarkable Other: No supplemental non-categorized findings. Musculoskeletal: 6 mm grade 1 anterolisthesis of L5 on S1 associated with chronic L5 pars defects. Moderate left and mild right foraminal stenosis at this level results. Small umbilical hernia contains adipose tissue. IMPRESSION: 1. An abscess spans from the wall of the distal sigmoid colon into the adjacent paracolic fat, with several locules of locally contained extraluminal gas. This is likely a diverticular abscess in the setting of diverticulitis. Less likely to be an ulceration or ulcerative mass of the colon wall leading to colon wall and paracolic abscess. Total size of the abnormal  collection of gas and fluid approximately 3.0 by 4.3 by 3.5 cm although a significant portion of this is within the wall of the sigmoid colon. 2. Mild diffuse hepatic steatosis. 3. Nonobstructive left kidney lower pole calculi including a larger 1.6 cm calculus along the lower renal pelvis. 4. 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 Electronically Signed   By: Gaylyn Rong M.D.   On: 09/25/2016 15:07   Dg Sinus/fist Tube Chk-non Gi  Result Date: 10/04/2016 CLINICAL DATA:  Sigmoid diverticular abscess, status post percutaneous drainage 09/26/2016 EXAM: ABSCESS INJECTION CONTRAST:  10 cc Omnipaque 300 FLUOROSCOPY TIME:  Fluoroscopy Time:  60 second Radiation Exposure Index (if provided by the fluoroscopic device): 90 mGy Number of Acquired Spot Images: 7 COMPARISON:  10/05/2006 FINDINGS: Injection performed of the anterior pelvic diverticular abscess drain. Irregular collapsed abscess cavity at the drain catheter site. Cavity is collapsed. Contrast tracks along adjacent pericolonic fat planes but no definite fistula to the adjacent sigmoid. Syringe aspiration completely collapses the cavity. IMPRESSION: Stable drain catheter position. Abscess cavity is collapsed without definite fistula to adjacent sigmoid. Electronically Signed   By: Judie Petit.  Shick M.D.   On: 10/04/2016 13:47   Ct Image Guided Drainage By Percutaneous Catheter  Result Date: 09/26/2016 INDICATION: Diverticulitis.  Left lower quadrant abscess. EXAM: CT GUIDED DRAINAGE OF A PELVIC ABSCESS MEDICATIONS: The patient is currently admitted to the hospital and receiving  intravenous antibiotics. The antibiotics were administered within an appropriate time frame prior to the initiation of the procedure. ANESTHESIA/SEDATION: One hundred fifty mg IV Versed Three mcg IV Fentanyl Moderate Sedation Time:  18 The patient was continuously monitored during the  procedure by the interventional radiology nurse under my direct supervision. COMPLICATIONS: None immediate. TECHNIQUE: Informed written consent was obtained from the patient after a thorough discussion of the procedural risks, benefits and alternatives. All questions were addressed. Maximal Sterile Barrier Technique was utilized including caps, mask, sterile gowns, sterile gloves, sterile drape, hand hygiene and skin antiseptic. A timeout was performed prior to the initiation of the procedure. PROCEDURE: The lower abdomen was prepped with ChloraPrep in a sterile fashion, and a sterile drape was applied covering the operative field. A sterile gown and sterile gloves were used for the procedure. Local anesthesia was provided with 1% Lidocaine. Under CT guidance, an 18 gauge needle was advanced into the left pelvic abscess. It was removed over an Amplatz wire. A 10 French dilator followed by a 10 [place] drain were inserted. It was looped and string fixed in the abscess cavity. It was sewn to the skin. Frank pus was aspirated. FINDINGS: Imaging documents 47 French drain placement into a left pelvic abscess. IMPRESSION: Successful 10 French left pelvic abscess drain. Electronically Signed   By: Jolaine Click M.D.   On: 09/26/2016 15:29    Labs:  CBC:  Recent Labs  09/25/16 1842 09/26/16 0554 09/28/16 0556 10/05/16 0756  WBC 9.5 9.5 9.5 5.3  HGB 14.2 13.7 13.2 14.0  HCT 40.7 40.0 37.7* 40.0  PLT 184 175 215 305    COAGS:  Recent Labs  09/26/16 0729 10/05/16 0756  INR 1.18 1.11  APTT  --  32    BMP:  Recent Labs  09/25/16 1842 09/26/16 0554 09/28/16 0556  NA 137 137 137  K 4.1 3.9 3.8  CL 102 105 107  CO2 27 23 22   GLUCOSE 111* 106* 99  BUN 11 12 10   CALCIUM 9.4 8.4* 8.8*  CREATININE 0.96 0.88 0.85  GFRNONAA >60 >60 >60  GFRAA >60 >60 >60    LIVER FUNCTION TESTS: No results for input(s): BILITOT, AST, ALT, ALKPHOS, PROT, ALBUMIN in the last 8760 hours.  TUMOR MARKERS: No  results for input(s): AFPTM, CEA, CA199, CHROMGRNA in the last 8760 hours.  Assessment and Plan:  Sigmoid colon diverticular abscess Drain was placed 09/26/16---resolved abscess per imaging yesterday. New abscess note Now scheduled for aspiration vs drain placement Risks and Benefits discussed with the patient including bleeding, infection, damage to adjacent structures, bowel perforation/fistula connection, and sepsis. All of the patient's questions were answered, patient is agreeable to proceed. Consent signed and in chart.  Thank you for this interesting consult.  I greatly enjoyed meeting JAYQUON THEILER and look forward to participating in their care.  A copy of this report was sent to the requesting provider on this date.  Electronically Signed: Robet Leu, PA-C 10/05/2016, 9:35 AM   I spent a total of    25 Minutes in face to face in clinical consultation, greater than 50% of which was counseling/coordinating care for pelvic abscess aspiration/drain

## 2016-10-05 NOTE — Procedures (Signed)
Pelvic abscess  S/p CT RT TRANSGLUTEAL ABSCESS DRAIN  8CC THICK BLOODY EXUDATIVE FLD ASPIRATED  No comp Stable Keep to suction bulb  Full report in PACS

## 2016-10-08 ENCOUNTER — Ambulatory Visit (HOSPITAL_COMMUNITY)
Admission: RE | Admit: 2016-10-08 | Discharge: 2016-10-08 | Disposition: A | Discharge status: Home / Self Care | Payer: 59 | Source: Ambulatory Visit | Attending: Student | Admitting: Student

## 2016-10-08 ENCOUNTER — Other Ambulatory Visit (HOSPITAL_COMMUNITY): Payer: Self-pay | Admitting: Student

## 2016-10-08 ENCOUNTER — Other Ambulatory Visit (HOSPITAL_COMMUNITY): Payer: Self-pay | Admitting: Interventional Radiology

## 2016-10-08 ENCOUNTER — Encounter (HOSPITAL_COMMUNITY): Payer: Self-pay | Admitting: Interventional Radiology

## 2016-10-08 ENCOUNTER — Ambulatory Visit (HOSPITAL_COMMUNITY)
Admission: RE | Admit: 2016-10-08 | Discharge: 2016-10-08 | Disposition: A | Discharge status: Home / Self Care | Payer: 59 | Source: Ambulatory Visit | Attending: Interventional Radiology | Admitting: Interventional Radiology

## 2016-10-08 DIAGNOSIS — K630 Abscess of intestine: Secondary | ICD-10-CM | POA: Diagnosis present

## 2016-10-08 DIAGNOSIS — K5730 Diverticulosis of large intestine without perforation or abscess without bleeding: Secondary | ICD-10-CM | POA: Insufficient documentation

## 2016-10-08 DIAGNOSIS — Z4803 Encounter for change or removal of drains: Secondary | ICD-10-CM | POA: Diagnosis not present

## 2016-10-08 DIAGNOSIS — M5136 Other intervertebral disc degeneration, lumbar region: Secondary | ICD-10-CM | POA: Diagnosis not present

## 2016-10-08 DIAGNOSIS — N200 Calculus of kidney: Secondary | ICD-10-CM | POA: Diagnosis not present

## 2016-10-08 HISTORY — PX: IR SINUS/FIST TUBE CHK-NON GI: IMG673

## 2016-10-08 MED ORDER — IOPAMIDOL (ISOVUE-300) INJECTION 61%
INTRAVENOUS | Status: AC
  Administered 2016-10-08: 100 mL
  Filled 2016-10-08: qty 100

## 2016-10-08 MED ORDER — IOPAMIDOL (ISOVUE-300) INJECTION 61%
INTRAVENOUS | Status: AC
  Administered 2016-10-08: 6 mL
  Filled 2016-10-08: qty 50

## 2016-10-09 ENCOUNTER — Other Ambulatory Visit: Payer: 59

## 2016-10-09 ENCOUNTER — Other Ambulatory Visit: Payer: Self-pay | Admitting: Gastroenterology

## 2016-10-09 ENCOUNTER — Ambulatory Visit (HOSPITAL_COMMUNITY): Payer: 59

## 2016-10-09 DIAGNOSIS — K630 Abscess of intestine: Secondary | ICD-10-CM

## 2016-10-10 LAB — AEROBIC/ANAEROBIC CULTURE (SURGICAL/DEEP WOUND): SPECIAL REQUESTS: NORMAL

## 2016-10-10 LAB — AEROBIC/ANAEROBIC CULTURE W GRAM STAIN (SURGICAL/DEEP WOUND): Culture: NO GROWTH

## 2016-10-16 ENCOUNTER — Ambulatory Visit (INDEPENDENT_AMBULATORY_CARE_PROVIDER_SITE_OTHER): Payer: 59 | Admitting: Physician Assistant

## 2016-10-16 ENCOUNTER — Encounter: Payer: Self-pay | Admitting: Physician Assistant

## 2016-10-16 VITALS — BP 103/71 | HR 75 | Temp 98.2°F | Resp 18 | Ht 67.95 in | Wt 211.8 lb

## 2016-10-16 DIAGNOSIS — E039 Hypothyroidism, unspecified: Secondary | ICD-10-CM | POA: Diagnosis not present

## 2016-10-16 MED ORDER — LIOTHYRONINE SODIUM 5 MCG PO TABS: 5.0000 ug | ORAL_TABLET | Freq: Every day | ORAL | 0 refills | Status: DC

## 2016-10-16 MED ORDER — LEVOTHYROXINE SODIUM 137 MCG PO TABS: 137.0000 ug | ORAL_TABLET | Freq: Every day | ORAL | 0 refills | Status: DC

## 2016-10-16 NOTE — Progress Notes (Signed)
10/16/2016 12:02 PM   DOB: 03/16/1976 / MRN: [id number]  SUBJECTIVE:  Shawn Gay is a 41 y.o. male presenting for check of hypothyroidism.  He takes 137 mcg of levothyroxine and 5 mg of Cytomel 5 mcg.  He has been out of these medications now for 1 month now. He has been feeling a little depressed. He was recently hospitalized for a diverticular abscess and tells me that this problem has largely resolved now.   Depression screen PHQ 2/9 10/16/2016  Decreased Interest 0  Down, Depressed, Hopeless 0  PHQ - 2 Score 0     He is allergic to gluten meal.   He  has a past medical history of Colonic diverticular abscess (09/25/2016); GERD (gastroesophageal reflux disease); Gluten intolerance; History of kidney stones; Hypothyroid; and UPJ obstruction, congenital.    He  reports that he has quit smoking. His smoking use included Cigarettes. He has never used smokeless tobacco. He reports that he drinks about 1.8 oz of alcohol per week . He reports that he does not use drugs. He  reports that he currently engages in sexual activity. The patient  has a past surgical history that includes Lithotripsy (2003 or 2004 X 2); Ureteroscopy (2003 or 2003); Finger amputation (Left, 1990's); Nephrolithotomy (Left, 07/23/2013); Endopyelotomy; Cystoscopy with ureteroscopy, stone basketry and stent placement; IR Sinus/Fist Tube Chk-Non GI (10/08/2016); and Cosmetic surgery.  His family history includes Cancer in his paternal grandfather; Diabetes in his paternal grandfather; Mental illness in his father.  Review of Systems  Constitutional: Negative for chills, diaphoresis and fever.  Eyes: Negative.   Respiratory: Negative for cough, hemoptysis, sputum production, shortness of breath and wheezing.   Cardiovascular: Negative for chest pain, orthopnea and leg swelling.  Gastrointestinal: Negative for abdominal pain, blood in stool, constipation, diarrhea, heartburn, melena, nausea and vomiting.  Genitourinary:  Negative for flank pain.  Skin: Negative for rash.  Neurological: Negative for dizziness, sensory change, speech change, focal weakness and headaches.    The problem list and medications were reviewed and updated by myself where necessary and exist elsewhere in the encounter.   OBJECTIVE:  BP 103/71 (BP Location: Right Arm, Patient Position: Sitting, Cuff Size: Large)   Pulse 75   Temp 98.2 F (36.8 C) (Oral)   Resp 18   Ht 5' 7.95" (1.726 m)   Wt 211 lb 12.8 oz (96.1 kg)   SpO2 97%   BMI 32.25 kg/m   Physical Exam  Constitutional: He appears well-developed. He is active and cooperative.  Non-toxic appearance.  Cardiovascular: Normal rate, regular rhythm, S1 normal, S2 normal, normal heart sounds, intact distal pulses and normal pulses.  Exam reveals no gallop and no friction rub.   No murmur heard. Pulmonary/Chest: Effort normal. No stridor. No tachypnea. No respiratory distress. He has no wheezes. He has no rales.  Abdominal: Soft. Normal appearance and bowel sounds are normal. He exhibits no distension and no mass. There is no tenderness. There is no rigidity, no rebound, no guarding and no CVA tenderness. No hernia.  Musculoskeletal: He exhibits no edema.  Neurological: He is alert.  Skin: Skin is warm and dry. He is not diaphoretic. No pallor.  Vitals reviewed.   No results found for this or any previous visit (from the past 72 hour(s)).  No results found.  ASSESSMENT AND PLAN:  Ivin BootyJoshua was seen today for request blood work.  Diagnoses and all orders for this visit:  Hypothyroidism, unspecified type: Restarting his medication.  He may  be going back home in as little as three weeks, but he may be required to stay as well.  I will give him 6 weeks of medication and advised that if he is still in town around weeks 4-5 to call and make an appointment to recheck his labs and then potentially titrate his medication.  -     TSH -     T4, Free -     Thyroid Peroxidase  Antibody -     levothyroxine (SYNTHROID, LEVOTHROID) 137 MCG tablet; Take 1 tablet (137 mcg total) by mouth daily before breakfast. -     liothyronine (CYTOMEL) 5 MCG tablet; Take 1 tablet (5 mcg total) by mouth daily.    The patient is advised to call or return to clinic if he does not see an improvement in symptoms, or to seek the care of the closest emergency department if he worsens with the above plan.   Deliah Boston, MHS, PA-C Primary Care at [facility] 10/16/2016 12:02 PM

## 2016-10-16 NOTE — Patient Instructions (Signed)
     IF you received an x-ray today, you will receive an invoice from [facility]. Please contact [facility] at [phone] with questions or concerns regarding your invoice.   IF you received labwork today, you will receive an invoice from [facility]. Please contact [facility] at [phone] with questions or concerns regarding your invoice.   Our billing staff will not be able to assist you with questions regarding bills from these companies.  You will be contacted with the lab results as soon as they are available. The fastest way to get your results is to activate your My Chart account. Instructions are located on the last page of this paperwork. If you have not heard from us regarding the results in 2 weeks, please contact this office.     

## 2016-10-17 LAB — TSH: TSH: 3.37 u[IU]/mL (ref 0.450–4.500)

## 2016-10-17 LAB — T4, FREE: Free T4: 1.08 ng/dL (ref 0.82–1.77)

## 2016-10-17 LAB — THYROID PEROXIDASE ANTIBODY: Thyroperoxidase Ab SerPl-aCnc: 17 IU/mL (ref 0–34)

## 2016-10-18 ENCOUNTER — Other Ambulatory Visit: Payer: 59

## 2016-12-03 ENCOUNTER — Telehealth: Payer: Self-pay | Admitting: Physician Assistant

## 2016-12-03 DIAGNOSIS — E039 Hypothyroidism, unspecified: Secondary | ICD-10-CM

## 2016-12-03 NOTE — Telephone Encounter (Signed)
Pt is calling to check on the status of his thyroid medication refills.  He states that he is out of the synthroid and cytomel prescriptions.  He states that Chestine Spore was waiting to see what his lab results were before refilling in case there needed to be any changes.  He is currently about to go back to [place] and the pharmacy phone number is listed below to call in scripts.  Please advise with lab results when ready as well.  Pharmacy:[phone] Pt mobile: [phone]

## 2016-12-04 MED ORDER — LEVOTHYROXINE SODIUM 137 MCG PO TABS: 137.0000 ug | ORAL_TABLET | Freq: Every day | ORAL | 0 refills | Status: AC

## 2016-12-04 MED ORDER — LIOTHYRONINE SODIUM 5 MCG PO TABS: 5.0000 ug | ORAL_TABLET | Freq: Every day | ORAL | 0 refills | Status: AC

## 2016-12-04 NOTE — Telephone Encounter (Signed)
Shawn Gay refill pt prescription because he has been out for a while and I will call pt to let him know that its been filled and f/u I a month for more refills

## 2016-12-04 NOTE — Telephone Encounter (Signed)
Meds ordered this encounter  Medications  . levothyroxine (SYNTHROID, LEVOTHROID) 137 MCG tablet    Sig: Take 1 tablet (137 mcg total) by mouth daily before breakfast.    Dispense:  42 tablet    Refill:  0    Order Specific Question:   Supervising Provider    Answer:   SHAW, EVA N [[id number]]  . liothyronine (CYTOMEL) 5 MCG tablet    Sig: Take 1 tablet (5 mcg total) by mouth daily.    Dispense:  42 tablet    Refill:  0    Order Specific Question:   Supervising Provider    Answer:   Sherren Mocha [phone]   Patient should schedule follow-up with Mr. Chestine Spore, PA-C.

## 2019-06-03 IMAGING — CT CT IMAGE GUIDED DRAINAGE BY PERCUTANEOUS CATHETER
1 of 4 series · 9 of 32 positions shown, 15 images · non-contrast
Comparison: none

INDICATION: PELVIC DIVERTICULAR ABSCESS
TECHNIQUE: Informed written consent was obtained from the patient after a
thorough discussion of the procedural risks, benefits and
alternatives. All questions were addressed. Maximal Sterile Barrier
Technique was utilized including caps, mask, sterile gowns, sterile
gloves, sterile drape, hand hygiene and skin antiseptic. A timeout
was performed prior to the initiation of the procedure.

[Series 2: i-spiral 5.0 b40f · axial · 0.92mm/px · z∈[-197,-64]mm · 9 of 48 slices shown, 15 images]
[im 5/48  soft-tissue]
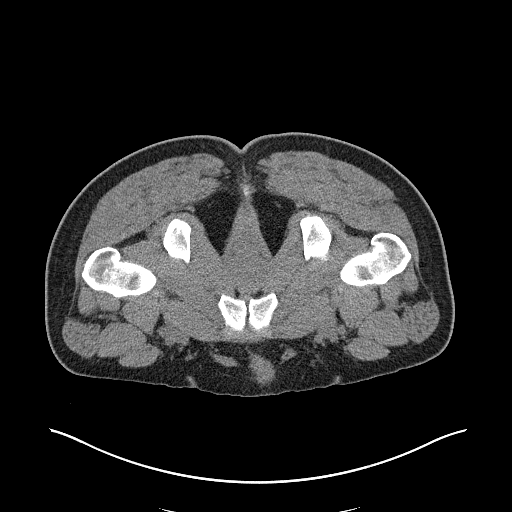
[im 5/48  bone]
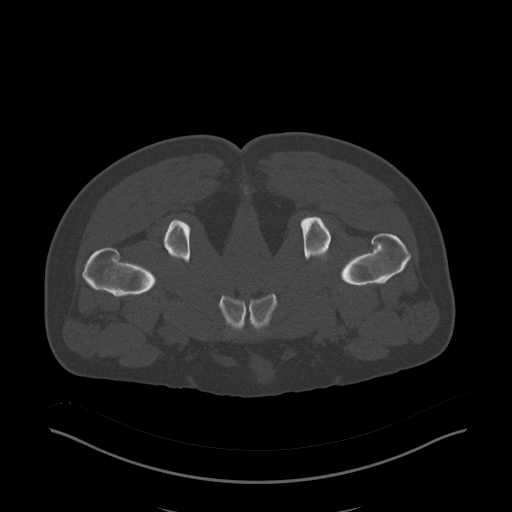
[im 10/48  soft-tissue]
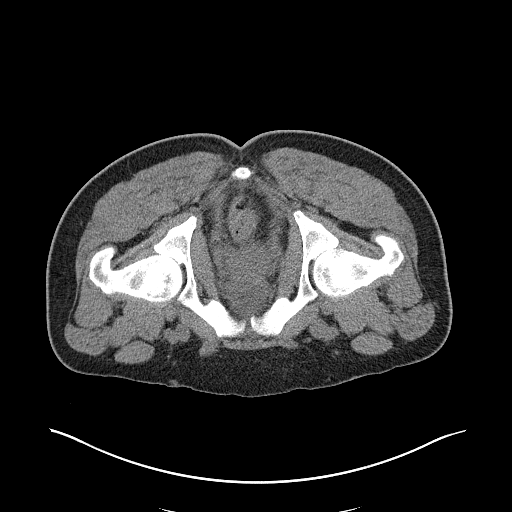
[im 15/48  soft-tissue]
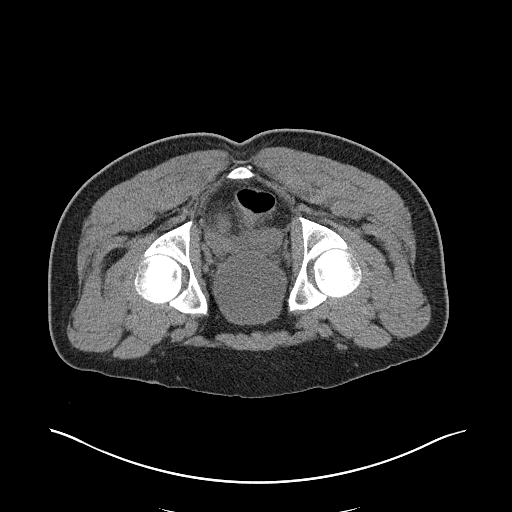
[im 19/48  soft-tissue]
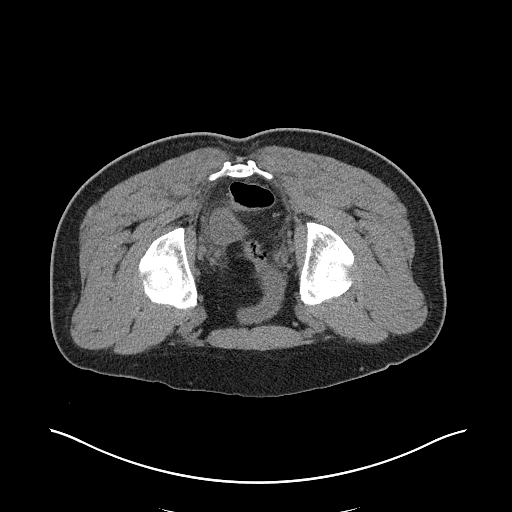
[im 24/48  soft-tissue]
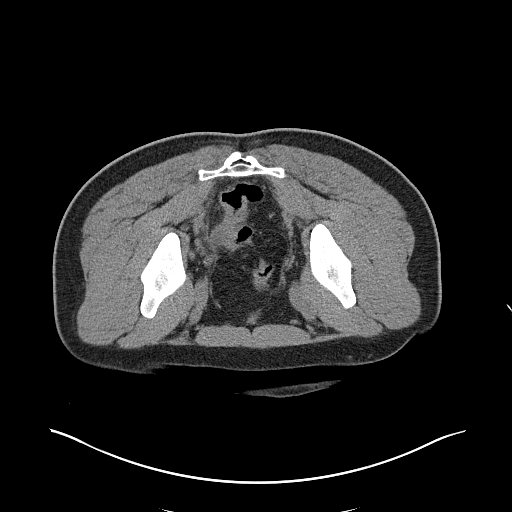
[im 29/48  soft-tissue]
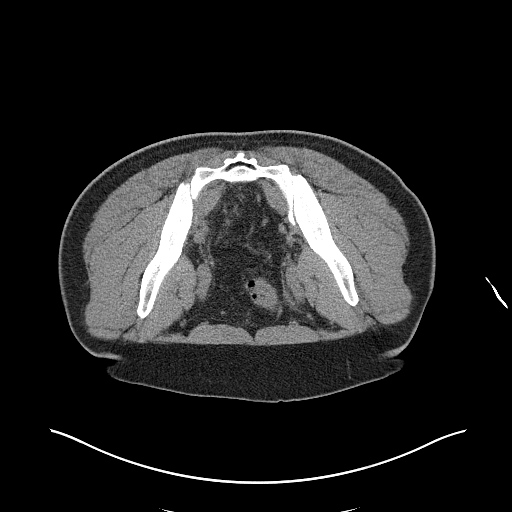
[im 29/48  lung]
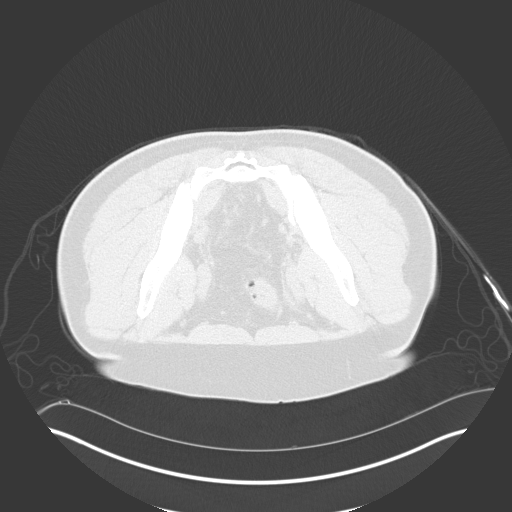
[im 33/48  soft-tissue]
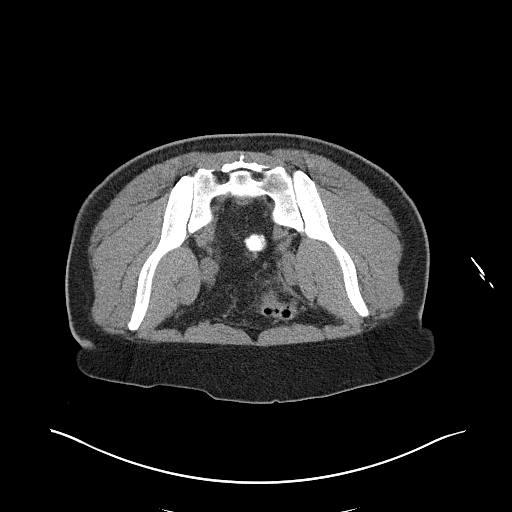
[im 33/48  lung]
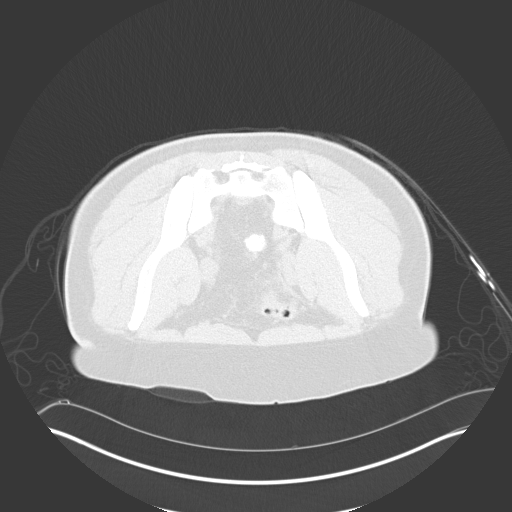
[im 38/48  soft-tissue]
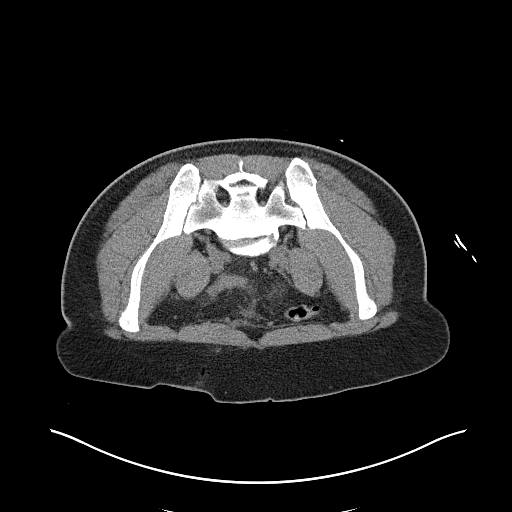
[im 38/48  lung]
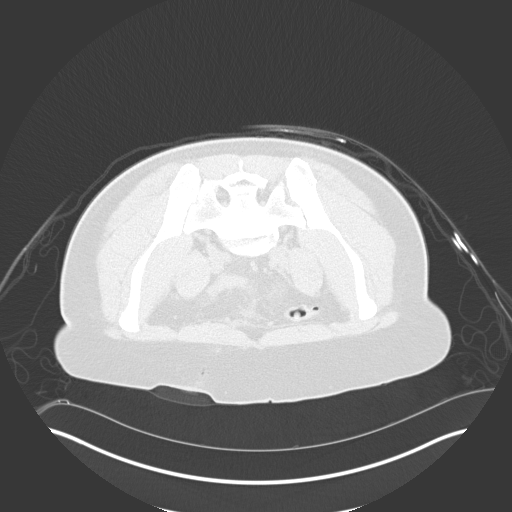
[im 43/48  soft-tissue]
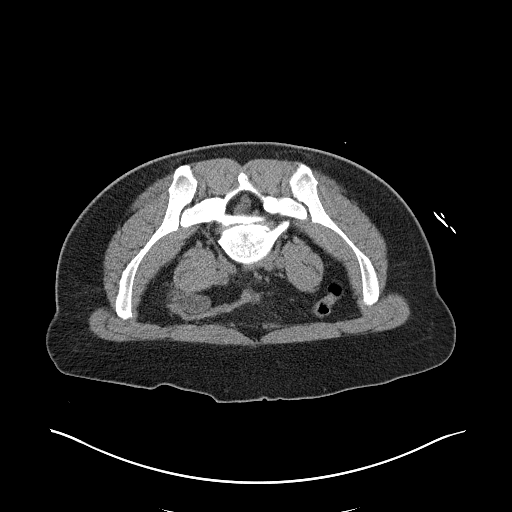
[im 43/48  lung]
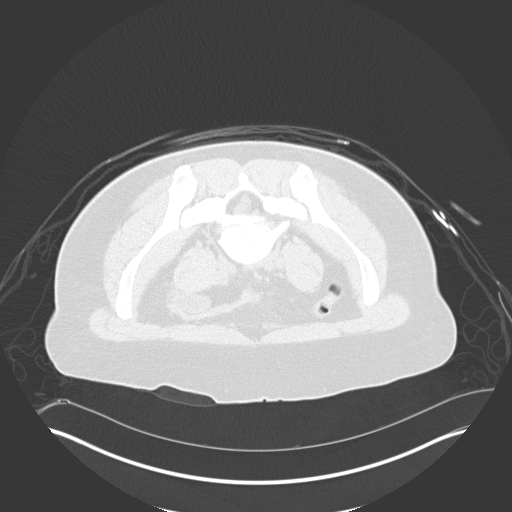
[im 43/48  bone]
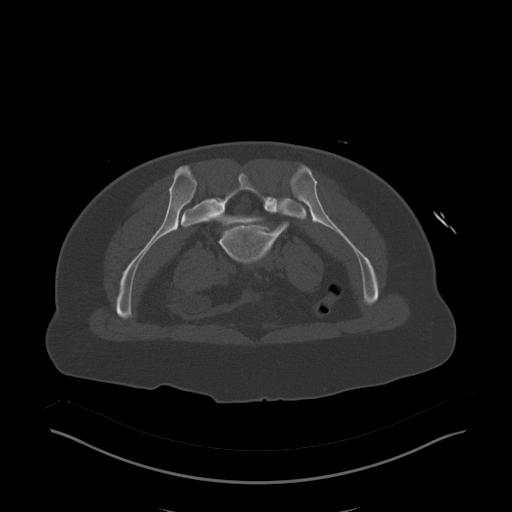

[9 of 32 positions shown; findings below may reference images not displayed]

EXAM:
CT GUIDED DRAINAGE OF THE PELVIC ABSCESS

MEDICATIONS:
The patient is currently admitted to the hospital and receiving
intravenous antibiotics. The antibiotics were administered within an
appropriate time frame prior to the initiation of the procedure.

ANESTHESIA/SEDATION:
4.0 mg IV Versed 100 mcg IV Fentanyl

Moderate Sedation Time:  36 minutes

The patient was continuously monitored during the procedure by the
interventional radiology nurse under my direct supervision.

COMPLICATIONS:
None immediate.
PROCEDURE:
The right gluteal region was prepped with ChloraPrep in a sterile
fashion, and a sterile drape was applied covering the operative
field. A sterile gown and sterile gloves were used for the
procedure. Local anesthesia was provided with 1% Lidocaine.

Patient positioned prone. Noncontrast localization CT performed. The
right posterior pelvic abscess was localized.

Overlying skin marked.

Under sterile conditions and local anesthesia, an 18 gauge access
needle was advanced from a right trans gluteal approach into the
small fluid collection. Needle position confirmed with CT. Guidewire
inserted. Guidewire position confirmed in the collection. Dilatation
performed to insert a drain catheter. Catheter position was anterior
to the collection. Under CT guidance attempts were made to
reposition the catheter, however this unsuccessful. Syringe
aspiration yielded approximately 8 cc bloody thick extubated fluid.

Catheter was removed. Access needle was readvanced into the fluid
collection and reconfirmed with CT. Guidewire coiled within the
small residual collection. Dilatation performed to insert the 10
French drain. Retention loop formed within the small collection and
confirmed with CT. Syringe aspiration yielded additional 2 cc of
thick extubated bloody fluid. Catheter secured with a Prolene suture
and connected to external suction bulb. No immediate complication.
Patient tolerated the procedure well.
FINDINGS: CT imaging confirms needle access for insertion of a small right
trans gluteal pelvic abscess drain as above.
IMPRESSION: Successful right trans gluteal pelvic abscess drain insertion.
